# Patient Record
Sex: Female | Born: 1990 | Race: Black or African American | Hispanic: No | Marital: Single
Health system: Southern US, Academic
[De-identification: ages and names within clinical notes are randomized; demographics above are authoritative.]

## PROBLEM LIST (undated history)

## (undated) ENCOUNTER — Telehealth: Attending: MS" | Primary: MS"

## (undated) ENCOUNTER — Telehealth

## (undated) ENCOUNTER — Encounter

## (undated) ENCOUNTER — Encounter: Attending: Maternal & Fetal Medicine | Primary: Maternal & Fetal Medicine

## (undated) ENCOUNTER — Ambulatory Visit

## (undated) ENCOUNTER — Encounter
Attending: Student in an Organized Health Care Education/Training Program | Primary: Student in an Organized Health Care Education/Training Program

## (undated) ENCOUNTER — Ambulatory Visit: Attending: Pharmacist | Primary: Pharmacist

## (undated) ENCOUNTER — Ambulatory Visit
Payer: PRIVATE HEALTH INSURANCE | Attending: Student in an Organized Health Care Education/Training Program | Primary: Student in an Organized Health Care Education/Training Program

## (undated) ENCOUNTER — Ambulatory Visit: Payer: PRIVATE HEALTH INSURANCE

## (undated) ENCOUNTER — Ambulatory Visit: Payer: MEDICAID | Attending: Internal Medicine | Primary: Internal Medicine

## (undated) ENCOUNTER — Ambulatory Visit: Payer: MEDICAID

## (undated) ENCOUNTER — Encounter: Attending: Ambulatory Care | Primary: Ambulatory Care

## (undated) ENCOUNTER — Ambulatory Visit: Payer: PRIVATE HEALTH INSURANCE | Attending: Family | Primary: Family

## (undated) ENCOUNTER — Encounter: Attending: Obstetrics & Gynecology | Primary: Obstetrics & Gynecology

## (undated) ENCOUNTER — Telehealth: Attending: Ambulatory Care | Primary: Ambulatory Care

## (undated) ENCOUNTER — Ambulatory Visit: Payer: MEDICAID | Attending: Obstetrics & Gynecology | Primary: Obstetrics & Gynecology

## (undated) ENCOUNTER — Ambulatory Visit
Payer: Medicaid (Managed Care) | Attending: Student in an Organized Health Care Education/Training Program | Primary: Student in an Organized Health Care Education/Training Program

## (undated) ENCOUNTER — Telehealth
Attending: Student in an Organized Health Care Education/Training Program | Primary: Student in an Organized Health Care Education/Training Program

## (undated) ENCOUNTER — Ambulatory Visit: Payer: Medicaid (Managed Care)

## (undated) ENCOUNTER — Ambulatory Visit: Payer: MEDICAID | Attending: Retina Specialist | Primary: Retina Specialist

## (undated) ENCOUNTER — Ambulatory Visit: Payer: MEDICAID | Attending: Family | Primary: Family

## (undated) ENCOUNTER — Encounter: Attending: Family | Primary: Family

## (undated) ENCOUNTER — Encounter: Attending: MS" | Primary: MS"

## (undated) ENCOUNTER — Ambulatory Visit: Payer: MEDICAID | Attending: Maternal & Fetal Medicine | Primary: Maternal & Fetal Medicine

## (undated) ENCOUNTER — Ambulatory Visit: Payer: MEDICAID | Attending: "Obstetric | Primary: "Obstetric

## (undated) ENCOUNTER — Telehealth: Attending: Maternal & Fetal Medicine | Primary: Maternal & Fetal Medicine

## (undated) ENCOUNTER — Telehealth: Attending: Obstetrics & Gynecology | Primary: Obstetrics & Gynecology

## (undated) ENCOUNTER — Ambulatory Visit
Attending: Student in an Organized Health Care Education/Training Program | Primary: Student in an Organized Health Care Education/Training Program

## (undated) ENCOUNTER — Encounter: Attending: Pharmacist | Primary: Pharmacist

## (undated) ENCOUNTER — Ambulatory Visit: Payer: Medicaid (Managed Care) | Attending: Family | Primary: Family

## (undated) ENCOUNTER — Ambulatory Visit: Payer: PRIVATE HEALTH INSURANCE | Attending: Nurse Practitioner | Primary: Nurse Practitioner

## (undated) ENCOUNTER — Ambulatory Visit: Attending: Adult Health | Primary: Adult Health

## (undated) ENCOUNTER — Telehealth: Attending: Family | Primary: Family

## (undated) DIAGNOSIS — M069 Rheumatoid arthritis, unspecified: Secondary | ICD-10-CM

## (undated) DIAGNOSIS — M329 Systemic lupus erythematosus, unspecified: Secondary | ICD-10-CM

## (undated) DIAGNOSIS — IMO0002 Reserved for concepts with insufficient information to code with codable children: Secondary | ICD-10-CM

---

## 2019-12-09 DIAGNOSIS — M329 Systemic lupus erythematosus, unspecified: Principal | ICD-10-CM

## 2020-02-23 ENCOUNTER — Ambulatory Visit: Admit: 2020-02-23 | Discharge: 2020-02-24 | Payer: MEDICAID | Attending: Internal Medicine | Primary: Internal Medicine

## 2020-02-23 DIAGNOSIS — M329 Systemic lupus erythematosus, unspecified: Principal | ICD-10-CM

## 2020-02-23 DIAGNOSIS — Z3A01 Less than 8 weeks gestation of pregnancy: Principal | ICD-10-CM

## 2020-02-23 MED ORDER — PREDNISONE 5 MG TABLET
ORAL_TABLET | ORAL | 0 refills | 0.00000 days | Status: CP
Start: 2020-02-23 — End: ?

## 2020-02-23 MED ORDER — HYDROXYCHLOROQUINE 200 MG TABLET
ORAL_TABLET | 4 refills | 0 days | Status: CP
Start: 2020-02-23 — End: ?

## 2020-03-22 ENCOUNTER — Telehealth: Admit: 2020-03-22 | Discharge: 2020-03-23 | Payer: MEDICAID | Attending: Women's Health | Primary: Women's Health

## 2020-03-22 DIAGNOSIS — O09893 Supervision of other high risk pregnancies, third trimester: Secondary | ICD-10-CM | POA: Insufficient documentation

## 2020-03-22 DIAGNOSIS — O99891 Other specified diseases and conditions complicating pregnancy: Secondary | ICD-10-CM | POA: Insufficient documentation

## 2020-03-22 DIAGNOSIS — O0991 Supervision of high risk pregnancy, unspecified, first trimester: Principal | ICD-10-CM

## 2020-03-22 DIAGNOSIS — O09899 Supervision of other high risk pregnancies, unspecified trimester: Principal | ICD-10-CM

## 2020-03-22 DIAGNOSIS — O3680X0 Pregnancy with inconclusive fetal viability, not applicable or unspecified: Principal | ICD-10-CM

## 2020-03-22 DIAGNOSIS — M329 Systemic lupus erythematosus, unspecified: Secondary | ICD-10-CM

## 2020-03-22 MED ORDER — DOXYLAMINE 10 MG-PYRIDOXINE (VIT B6) 10 MG TABLET,DELAYED RELEASE
ORAL_TABLET | Freq: Every evening | ORAL | 3 refills | 30.00000 days | Status: CP
Start: 2020-03-22 — End: ?

## 2020-04-06 ENCOUNTER — Telehealth: Admit: 2020-04-06 | Discharge: 2020-04-07 | Payer: MEDICAID | Attending: MS" | Primary: MS"

## 2020-04-07 ENCOUNTER — Other Ambulatory Visit
Admit: 2020-04-07 | Discharge: 2020-04-07 | Payer: MEDICAID | Attending: Student in an Organized Health Care Education/Training Program | Primary: Student in an Organized Health Care Education/Training Program

## 2020-04-07 ENCOUNTER — Ambulatory Visit: Admit: 2020-04-07 | Discharge: 2020-04-07 | Payer: MEDICAID

## 2020-04-07 DIAGNOSIS — O99891 Systemic lupus erythematosus affecting pregnancy (CMS-HCC): Secondary | ICD-10-CM

## 2020-04-07 DIAGNOSIS — O351XX0 Maternal care for (suspected) chromosomal abnormality in fetus, not applicable or unspecified: Principal | ICD-10-CM

## 2020-04-07 DIAGNOSIS — Z3A11 11 weeks gestation of pregnancy: Principal | ICD-10-CM

## 2020-04-07 DIAGNOSIS — O0991 Supervision of high risk pregnancy, unspecified, first trimester: Principal | ICD-10-CM

## 2020-04-07 DIAGNOSIS — O30041 Twin pregnancy, dichorionic/diamniotic, first trimester: Principal | ICD-10-CM

## 2020-04-07 DIAGNOSIS — M329 Systemic lupus erythematosus, unspecified: Principal | ICD-10-CM

## 2020-04-07 DIAGNOSIS — O09899 Supervision of other high risk pregnancies, unspecified trimester: Principal | ICD-10-CM

## 2020-04-09 DIAGNOSIS — O23599 Infection of other part of genital tract in pregnancy, unspecified trimester: Principal | ICD-10-CM

## 2020-04-09 DIAGNOSIS — B9689 Other specified bacterial agents as the cause of diseases classified elsewhere: Principal | ICD-10-CM

## 2020-04-09 MED ORDER — METRONIDAZOLE 500 MG TABLET
ORAL_TABLET | Freq: Two times a day (BID) | ORAL | 0 refills | 7.00000 days | Status: CP
Start: 2020-04-09 — End: 2020-04-16

## 2020-04-14 DIAGNOSIS — O30041 Twin pregnancy, dichorionic/diamniotic, first trimester: Principal | ICD-10-CM

## 2020-04-14 DIAGNOSIS — O351XX0 Maternal care for (suspected) chromosomal abnormality in fetus, not applicable or unspecified: Principal | ICD-10-CM

## 2020-05-06 ENCOUNTER — Ambulatory Visit
Admit: 2020-05-06 | Discharge: 2020-05-06 | Payer: MEDICAID | Attending: Maternal & Fetal Medicine | Primary: Maternal & Fetal Medicine

## 2020-05-06 ENCOUNTER — Ambulatory Visit: Admit: 2020-05-06 | Discharge: 2020-05-06 | Payer: MEDICAID

## 2020-05-06 ENCOUNTER — Other Ambulatory Visit: Admit: 2020-05-06 | Discharge: 2020-05-07 | Payer: MEDICAID

## 2020-05-06 DIAGNOSIS — O30041 Twin pregnancy, dichorionic/diamniotic, first trimester: Principal | ICD-10-CM

## 2020-05-06 DIAGNOSIS — O99891 Systemic lupus erythematosus affecting pregnancy (CMS-HCC): Principal | ICD-10-CM

## 2020-05-06 DIAGNOSIS — O09899 Supervision of other high risk pregnancies, unspecified trimester: Principal | ICD-10-CM

## 2020-05-06 DIAGNOSIS — M329 Systemic lupus erythematosus, unspecified: Principal | ICD-10-CM

## 2020-05-06 DIAGNOSIS — O0991 Supervision of high risk pregnancy, unspecified, first trimester: Principal | ICD-10-CM

## 2020-05-06 MED ORDER — ASPIRIN 81 MG TABLET,DELAYED RELEASE
ORAL_TABLET | Freq: Every day | ORAL | 9 refills | 30 days | Status: CP
Start: 2020-05-06 — End: ?

## 2020-05-06 MED ORDER — FAMOTIDINE 20 MG TABLET
ORAL_TABLET | Freq: Two times a day (BID) | ORAL | 1 refills | 30 days | Status: CP
Start: 2020-05-06 — End: 2021-05-06

## 2020-05-12 ENCOUNTER — Ambulatory Visit: Admit: 2020-05-12 | Discharge: 2020-05-13 | Payer: MEDICAID

## 2020-05-12 DIAGNOSIS — M329 Systemic lupus erythematosus, unspecified: Secondary | ICD-10-CM

## 2020-05-12 DIAGNOSIS — O30041 Twin pregnancy, dichorionic/diamniotic, first trimester: Principal | ICD-10-CM

## 2020-05-12 DIAGNOSIS — O99891 Systemic lupus erythematosus affecting pregnancy (CMS-HCC): Secondary | ICD-10-CM

## 2020-05-12 DIAGNOSIS — O0991 Supervision of high risk pregnancy, unspecified, first trimester: Principal | ICD-10-CM

## 2020-05-12 DIAGNOSIS — O09899 Supervision of other high risk pregnancies, unspecified trimester: Principal | ICD-10-CM

## 2020-05-12 MED ORDER — METOCLOPRAMIDE 10 MG TABLET
ORAL_TABLET | Freq: Every evening | ORAL | 3 refills | 0.00000 days | Status: CP | PRN
Start: 2020-05-12 — End: 2021-05-12

## 2020-05-12 MED ORDER — ONDANSETRON HCL 4 MG TABLET
ORAL_TABLET | Freq: Three times a day (TID) | ORAL | 1 refills | 10.00000 days | Status: CP | PRN
Start: 2020-05-12 — End: 2021-05-12

## 2020-05-27 ENCOUNTER — Ambulatory Visit
Admit: 2020-05-27 | Discharge: 2020-05-28 | Payer: MEDICAID | Attending: Obstetrics & Gynecology | Primary: Obstetrics & Gynecology

## 2020-05-27 ENCOUNTER — Ambulatory Visit: Admit: 2020-05-27 | Discharge: 2020-05-28 | Payer: MEDICAID

## 2020-05-27 DIAGNOSIS — O30041 Twin pregnancy, dichorionic/diamniotic, first trimester: Principal | ICD-10-CM

## 2020-05-27 DIAGNOSIS — M329 Systemic lupus erythematosus, unspecified: Secondary | ICD-10-CM

## 2020-05-27 DIAGNOSIS — O99891 Systemic lupus erythematosus affecting pregnancy (CMS-HCC): Principal | ICD-10-CM

## 2020-05-27 DIAGNOSIS — N390 Urinary tract infection, site not specified: Principal | ICD-10-CM

## 2020-05-27 DIAGNOSIS — O0991 Supervision of high risk pregnancy, unspecified, first trimester: Principal | ICD-10-CM

## 2020-06-09 ENCOUNTER — Ambulatory Visit: Admit: 2020-06-09 | Discharge: 2020-06-10 | Payer: MEDICAID

## 2020-06-09 ENCOUNTER — Encounter: Admit: 2020-06-09 | Discharge: 2020-06-09 | Payer: MEDICAID

## 2020-06-09 DIAGNOSIS — M329 Systemic lupus erythematosus, unspecified: Principal | ICD-10-CM

## 2020-06-09 DIAGNOSIS — O0991 Supervision of high risk pregnancy, unspecified, first trimester: Principal | ICD-10-CM

## 2020-06-09 DIAGNOSIS — O99891 Systemic lupus erythematosus affecting pregnancy (CMS-HCC): Secondary | ICD-10-CM

## 2020-06-09 DIAGNOSIS — N390 Urinary tract infection, site not specified: Principal | ICD-10-CM

## 2020-06-09 DIAGNOSIS — O30041 Twin pregnancy, dichorionic/diamniotic, first trimester: Principal | ICD-10-CM

## 2020-06-13 DIAGNOSIS — O2340 Unspecified infection of urinary tract in pregnancy, unspecified trimester: Principal | ICD-10-CM

## 2020-06-13 MED ORDER — NITROFURANTOIN MONOHYDRATE/MACROCRYSTALS 100 MG CAPSULE
ORAL_CAPSULE | Freq: Two times a day (BID) | ORAL | 0 refills | 7.00000 days | Status: CP
Start: 2020-06-13 — End: 2020-06-20

## 2020-06-28 ENCOUNTER — Ambulatory Visit
Admit: 2020-06-28 | Discharge: 2020-06-29 | Payer: MEDICAID | Attending: Obstetrics & Gynecology | Primary: Obstetrics & Gynecology

## 2020-06-28 ENCOUNTER — Encounter
Admit: 2020-06-28 | Discharge: 2020-06-28 | Payer: MEDICAID | Attending: Obstetrics & Gynecology | Primary: Obstetrics & Gynecology

## 2020-06-28 DIAGNOSIS — Z315 Encounter for genetic counseling: Principal | ICD-10-CM

## 2020-06-28 DIAGNOSIS — O99891 Systemic lupus erythematosus affecting pregnancy (CMS-HCC): Secondary | ICD-10-CM

## 2020-06-28 DIAGNOSIS — M329 Systemic lupus erythematosus, unspecified: Secondary | ICD-10-CM

## 2020-06-28 DIAGNOSIS — O09899 Supervision of other high risk pregnancies, unspecified trimester: Principal | ICD-10-CM

## 2020-06-28 DIAGNOSIS — O0991 Supervision of high risk pregnancy, unspecified, first trimester: Principal | ICD-10-CM

## 2020-06-28 DIAGNOSIS — B379 Candidiasis, unspecified: Principal | ICD-10-CM

## 2020-06-28 DIAGNOSIS — N390 Urinary tract infection, site not specified: Principal | ICD-10-CM

## 2020-06-28 MED ORDER — NITROFURANTOIN MONOHYDRATE/MACROCRYSTALS 100 MG CAPSULE
ORAL_CAPSULE | Freq: Every day | ORAL | 3 refills | 30 days | Status: CP
Start: 2020-06-28 — End: 2020-07-28

## 2020-06-28 MED ORDER — TRIAMCINOLONE ACETONIDE 0.1 % TOPICAL CREAM
Freq: Two times a day (BID) | TOPICAL | 0 refills | 0.00000 days | Status: CP
Start: 2020-06-28 — End: 2021-06-28

## 2020-06-28 MED ORDER — AMOXICILLIN 875 MG-POTASSIUM CLAVULANATE 125 MG TABLET
ORAL_TABLET | Freq: Two times a day (BID) | ORAL | 0 refills | 10 days | Status: CP
Start: 2020-06-28 — End: 2020-07-08

## 2020-07-05 ENCOUNTER — Ambulatory Visit: Admit: 2020-07-05 | Discharge: 2020-07-06 | Payer: MEDICAID

## 2020-07-05 ENCOUNTER — Other Ambulatory Visit: Admit: 2020-07-05 | Discharge: 2020-07-06 | Payer: MEDICAID

## 2020-07-05 DIAGNOSIS — O365990 Maternal care for other known or suspected poor fetal growth, unspecified trimester, not applicable or unspecified: Principal | ICD-10-CM

## 2020-07-07 ENCOUNTER — Telehealth: Admit: 2020-07-07 | Discharge: 2020-07-08 | Payer: MEDICAID | Attending: MS" | Primary: MS"

## 2020-07-08 ENCOUNTER — Ambulatory Visit: Admit: 2020-07-08 | Discharge: 2020-07-09 | Payer: MEDICAID

## 2020-07-12 ENCOUNTER — Ambulatory Visit: Admit: 2020-07-12 | Discharge: 2020-07-13 | Payer: MEDICAID | Attending: "Obstetric | Primary: "Obstetric

## 2020-07-12 ENCOUNTER — Encounter: Admit: 2020-07-12 | Discharge: 2020-07-12 | Payer: MEDICAID | Attending: "Obstetric | Primary: "Obstetric

## 2020-07-12 ENCOUNTER — Ambulatory Visit: Admit: 2020-07-12 | Discharge: 2020-07-13 | Payer: MEDICAID

## 2020-07-12 DIAGNOSIS — O99891 Systemic lupus erythematosus affecting pregnancy (CMS-HCC): Principal | ICD-10-CM

## 2020-07-12 DIAGNOSIS — O30041 Twin pregnancy, dichorionic/diamniotic, first trimester: Principal | ICD-10-CM

## 2020-07-12 DIAGNOSIS — N390 Urinary tract infection, site not specified: Principal | ICD-10-CM

## 2020-07-12 DIAGNOSIS — B379 Candidiasis, unspecified: Principal | ICD-10-CM

## 2020-07-12 DIAGNOSIS — M329 Systemic lupus erythematosus, unspecified: Principal | ICD-10-CM

## 2020-07-12 DIAGNOSIS — O09899 Supervision of other high risk pregnancies, unspecified trimester: Principal | ICD-10-CM

## 2020-07-12 DIAGNOSIS — O2342 Unspecified infection of urinary tract in pregnancy, second trimester: Principal | ICD-10-CM

## 2020-07-12 DIAGNOSIS — O365990 Maternal care for other known or suspected poor fetal growth, unspecified trimester, not applicable or unspecified: Principal | ICD-10-CM

## 2020-07-12 DIAGNOSIS — O0991 Supervision of high risk pregnancy, unspecified, first trimester: Principal | ICD-10-CM

## 2020-07-12 MED ORDER — FAMOTIDINE 20 MG TABLET
ORAL_TABLET | 1 refills | 0 days | Status: CP
Start: 2020-07-12 — End: ?

## 2020-07-26 ENCOUNTER — Institutional Professional Consult (permissible substitution): Admit: 2020-07-26 | Discharge: 2020-07-27 | Payer: MEDICAID

## 2020-07-26 ENCOUNTER — Ambulatory Visit: Admit: 2020-07-26 | Discharge: 2020-07-27 | Payer: MEDICAID

## 2020-07-26 ENCOUNTER — Other Ambulatory Visit
Admit: 2020-07-26 | Discharge: 2020-07-27 | Payer: MEDICAID | Attending: Obstetrics & Gynecology | Primary: Obstetrics & Gynecology

## 2020-07-26 ENCOUNTER — Encounter
Admit: 2020-07-26 | Discharge: 2020-07-26 | Payer: MEDICAID | Attending: Obstetrics & Gynecology | Primary: Obstetrics & Gynecology

## 2020-07-26 DIAGNOSIS — O365990 Maternal care for other known or suspected poor fetal growth, unspecified trimester, not applicable or unspecified: Principal | ICD-10-CM

## 2020-07-26 DIAGNOSIS — O0993 Supervision of high risk pregnancy, unspecified, third trimester: Principal | ICD-10-CM

## 2020-07-27 ENCOUNTER — Ambulatory Visit
Admit: 2020-07-27 | Discharge: 2020-08-03 | Disposition: A | Discharge status: Home / Self Care | Payer: MEDICAID | Source: Ambulatory Visit | Admitting: Obstetrics & Gynecology

## 2020-07-27 ENCOUNTER — Encounter
Admit: 2020-07-27 | Discharge: 2020-08-03 | Disposition: A | Discharge status: Home / Self Care | Payer: MEDICAID | Source: Ambulatory Visit | Attending: Student in an Organized Health Care Education/Training Program | Admitting: Obstetrics & Gynecology

## 2020-08-03 MED ORDER — ACETAMINOPHEN 325 MG TABLET
ORAL_TABLET | Freq: Four times a day (QID) | ORAL | 0 refills | 15.00000 days | Status: CP
Start: 2020-08-03 — End: ?

## 2020-08-03 MED ORDER — SIMETHICONE 80 MG CHEWABLE TABLET
ORAL_TABLET | Freq: Four times a day (QID) | ORAL | 0 refills | 8.00000 days | Status: CP | PRN
Start: 2020-08-03 — End: 2020-09-02

## 2020-08-03 MED ORDER — IBUPROFEN 600 MG TABLET
ORAL_TABLET | Freq: Four times a day (QID) | ORAL | 0 refills | 15 days | Status: CP
Start: 2020-08-03 — End: ?

## 2020-08-03 MED ORDER — ONDANSETRON 4 MG DISINTEGRATING TABLET
ORAL_TABLET | Freq: Three times a day (TID) | ORAL | 0 refills | 7 days | Status: CP | PRN
Start: 2020-08-03 — End: 2020-08-10

## 2020-08-03 MED ORDER — OXYCODONE 5 MG TABLET
ORAL_TABLET | Freq: Four times a day (QID) | ORAL | 0 refills | 3.00000 days | Status: CP | PRN
Start: 2020-08-03 — End: 2020-08-08

## 2020-08-03 MED ORDER — FERROUS SULFATE 325 MG (65 MG IRON) TABLET
ORAL_TABLET | ORAL | 11 refills | 30 days | Status: CP
Start: 2020-08-03 — End: 2021-08-03

## 2020-08-03 MED ORDER — ETONOGESTREL 0.12 MG-ETHINYL ESTRADIOL 0.015 MG/24 HR VAGINAL RING
4 refills | 0 days | Status: CP
Start: 2020-08-03 — End: 2020-08-03

## 2020-08-31 MED ORDER — ETONOGESTREL 0.12 MG-ETHINYL ESTRADIOL 0.015 MG/24 HR VAGINAL RING
TRANSDERMAL | 4 refills | 0.00000 days | Status: CP
Start: 2020-08-31 — End: 2021-08-03

## 2020-09-23 ENCOUNTER — Ambulatory Visit: Admit: 2020-09-23 | Payer: MEDICAID | Attending: Obstetrics & Gynecology | Primary: Obstetrics & Gynecology

## 2021-03-14 ENCOUNTER — Telehealth: Admit: 2021-03-14 | Discharge: 2021-03-15 | Payer: MEDICAID | Attending: Family | Primary: Family

## 2021-03-14 MED ORDER — PREDNISONE 5 MG TABLET
ORAL_TABLET | ORAL | 0 refills | 12.00000 days | Status: CP
Start: 2021-03-14 — End: 2021-03-26

## 2021-03-28 ENCOUNTER — Ambulatory Visit: Admit: 2021-03-28 | Discharge: 2021-03-29 | Payer: MEDICAID

## 2021-03-28 ENCOUNTER — Ambulatory Visit: Admit: 2021-03-28 | Discharge: 2021-03-29 | Payer: MEDICAID | Attending: Family | Primary: Family

## 2021-03-28 DIAGNOSIS — M329 Systemic lupus erythematosus, unspecified: Principal | ICD-10-CM

## 2021-03-28 DIAGNOSIS — D649 Anemia, unspecified: Principal | ICD-10-CM

## 2021-04-03 ENCOUNTER — Ambulatory Visit: Admit: 2021-04-03 | Payer: MEDICAID | Attending: Retina Specialist | Primary: Retina Specialist

## 2021-04-03 DIAGNOSIS — M2550 Pain in unspecified joint: Principal | ICD-10-CM

## 2021-04-04 MED ORDER — FOLIC ACID 1 MG TABLET
ORAL_TABLET | Freq: Every day | ORAL | 11 refills | 30.00000 days | Status: CP
Start: 2021-04-04 — End: 2022-04-04

## 2021-04-04 MED ORDER — METHOTREXATE SODIUM 2.5 MG TABLET
ORAL_TABLET | ORAL | 0 refills | 28 days | Status: CP
Start: 2021-04-04 — End: 2021-05-04

## 2021-04-04 MED ORDER — FERROUS GLUCONATE 240 MG (27 MG IRON) TABLET
ORAL_TABLET | Freq: Three times a day (TID) | ORAL | 0 refills | 90 days | Status: CP
Start: 2021-04-04 — End: 2021-07-03

## 2021-05-16 DIAGNOSIS — M1990 Unspecified osteoarthritis, unspecified site: Principal | ICD-10-CM

## 2021-05-16 MED ORDER — PREDNISONE 5 MG TABLET
ORAL_TABLET | ORAL | 0 refills | 12.00000 days | Status: CP
Start: 2021-05-16 — End: 2021-05-16

## 2021-05-16 MED ORDER — DEXAMETHASONE 1 MG TABLET
ORAL_TABLET | ORAL | 0 refills | 15.00000 days | Status: CP
Start: 2021-05-16 — End: 2021-05-31

## 2021-07-12 ENCOUNTER — Ambulatory Visit: Admit: 2021-07-12 | Payer: MEDICAID | Attending: Family | Primary: Family

## 2021-08-23 ENCOUNTER — Emergency Department (HOSPITAL_BASED_OUTPATIENT_CLINIC_OR_DEPARTMENT_OTHER)
Admission: EM | Admit: 2021-08-23 | Discharge: 2021-08-23 | Disposition: A | Discharge status: Home / Self Care | Payer: Medicaid Other | Attending: Emergency Medicine | Admitting: Emergency Medicine

## 2021-08-23 ENCOUNTER — Other Ambulatory Visit: Payer: Self-pay

## 2021-08-23 ENCOUNTER — Emergency Department (HOSPITAL_BASED_OUTPATIENT_CLINIC_OR_DEPARTMENT_OTHER): Payer: Medicaid Other

## 2021-08-23 ENCOUNTER — Other Ambulatory Visit (HOSPITAL_COMMUNITY): Payer: Self-pay

## 2021-08-23 ENCOUNTER — Ambulatory Visit (INDEPENDENT_AMBULATORY_CARE_PROVIDER_SITE_OTHER): Payer: Medicaid Other | Admitting: *Deleted

## 2021-08-23 ENCOUNTER — Encounter (HOSPITAL_BASED_OUTPATIENT_CLINIC_OR_DEPARTMENT_OTHER): Payer: Self-pay

## 2021-08-23 DIAGNOSIS — U071 COVID-19: Secondary | ICD-10-CM | POA: Insufficient documentation

## 2021-08-23 DIAGNOSIS — J9801 Acute bronchospasm: Secondary | ICD-10-CM

## 2021-08-23 DIAGNOSIS — R059 Cough, unspecified: Secondary | ICD-10-CM | POA: Diagnosis present

## 2021-08-23 HISTORY — DX: Systemic lupus erythematosus, unspecified: M32.9

## 2021-08-23 HISTORY — DX: Rheumatoid arthritis, unspecified: M06.9

## 2021-08-23 HISTORY — DX: Reserved for concepts with insufficient information to code with codable children: IMO0002

## 2021-08-23 LAB — RESP PANEL BY RT-PCR (FLU A&B, COVID) ARPGX2
Influenza A by PCR: NEGATIVE
Influenza B by PCR: NEGATIVE
SARS Coronavirus 2 by RT PCR: POSITIVE — AB

## 2021-08-23 LAB — CBC WITH DIFFERENTIAL/PLATELET
Abs Immature Granulocytes: 0.01 10*3/uL (ref 0.00–0.07)
Basophils Absolute: 0 10*3/uL (ref 0.0–0.1)
Basophils Relative: 0 %
Eosinophils Absolute: 0.2 10*3/uL (ref 0.0–0.5)
Eosinophils Relative: 3 %
HCT: 27.5 % — ABNORMAL LOW (ref 36.0–46.0)
Hemoglobin: 8.9 g/dL — ABNORMAL LOW (ref 12.0–15.0)
Immature Granulocytes: 0 %
Lymphocytes Relative: 31 %
Lymphs Abs: 1.5 10*3/uL (ref 0.7–4.0)
MCH: 26.1 pg (ref 26.0–34.0)
MCHC: 32.4 g/dL (ref 30.0–36.0)
MCV: 80.6 fL (ref 80.0–100.0)
Monocytes Absolute: 0.4 10*3/uL (ref 0.1–1.0)
Monocytes Relative: 8 %
Neutro Abs: 2.8 10*3/uL (ref 1.7–7.7)
Neutrophils Relative %: 58 %
Platelets: 387 10*3/uL (ref 150–400)
RBC: 3.41 MIL/uL — ABNORMAL LOW (ref 3.87–5.11)
RDW: 15.7 % — ABNORMAL HIGH (ref 11.5–15.5)
WBC: 4.9 10*3/uL (ref 4.0–10.5)
nRBC: 0 % (ref 0.0–0.2)

## 2021-08-23 LAB — BASIC METABOLIC PANEL
Anion gap: 11 (ref 5–15)
BUN: 8 mg/dL (ref 6–20)
CO2: 18 mmol/L — ABNORMAL LOW (ref 22–32)
Calcium: 9.3 mg/dL (ref 8.9–10.3)
Chloride: 105 mmol/L (ref 98–111)
Creatinine, Ser: 0.47 mg/dL (ref 0.44–1.00)
GFR, Estimated: >60 mL/min (ref >60)
Glucose, Bld: 88 mg/dL (ref 70–99)
Potassium: 3.3 mmol/L — ABNORMAL LOW (ref 3.5–5.1)
Sodium: 134 mmol/L — ABNORMAL LOW (ref 135–145)

## 2021-08-23 LAB — HCG, QUANTITATIVE, PREGNANCY: hCG, Beta Chain, Quant, S: 40970 m[IU]/mL — ABNORMAL HIGH (ref <5)

## 2021-08-23 MED ORDER — DIPHENHYDRAMINE HCL 50 MG/ML IJ SOLN: 50.0000 mg | Freq: Once | INTRAMUSCULAR | Status: AC | PRN

## 2021-08-23 MED ORDER — EPINEPHRINE 0.3 MG/0.3ML IJ SOAJ: 0.3000 mg | Freq: Once | INTRAMUSCULAR | Status: AC | PRN

## 2021-08-23 MED ORDER — ACETAMINOPHEN 500 MG PO TABS
1000.0000 mg | ORAL_TABLET | Freq: Once | ORAL | Status: AC
  Administered 2021-08-23: 1000 mg via ORAL
  Filled 2021-08-23: qty 2

## 2021-08-23 MED ORDER — ALBUTEROL SULFATE HFA 108 (90 BASE) MCG/ACT IN AERS
6.0000 | INHALATION_SPRAY | Freq: Once | RESPIRATORY_TRACT | Status: AC
  Administered 2021-08-23: 6 via RESPIRATORY_TRACT
  Filled 2021-08-23: qty 6.7

## 2021-08-23 MED ORDER — METHYLPREDNISOLONE SODIUM SUCC 125 MG IJ SOLR: 125.0000 mg | Freq: Once | INTRAMUSCULAR | Status: AC | PRN

## 2021-08-23 MED ORDER — FAMOTIDINE IN NACL 20-0.9 MG/50ML-% IV SOLN: 20.0000 mg | Freq: Once | INTRAVENOUS | Status: AC | PRN

## 2021-08-23 MED ORDER — BEBTELOVIMAB 175 MG/2 ML IV (EUA): 175.0000 mg | Freq: Once | INTRAMUSCULAR | Status: AC

## 2021-08-23 MED ORDER — SODIUM CHLORIDE 0.9 % IV SOLN: INTRAVENOUS | Status: AC | PRN

## 2021-08-23 MED ORDER — ALBUTEROL SULFATE HFA 108 (90 BASE) MCG/ACT IN AERS: 2.0000 | INHALATION_SPRAY | Freq: Once | RESPIRATORY_TRACT | Status: AC | PRN

## 2021-08-23 MED ORDER — PREDNISONE 20 MG PO TABS
20.0000 mg | ORAL_TABLET | Freq: Once | ORAL | Status: AC
  Administered 2021-08-23: 20 mg via ORAL
  Filled 2021-08-23: qty 1

## 2021-08-23 MED ORDER — BEBTELOVIMAB 175 MG/2 ML IV (EUA)
175.0000 mg | Freq: Once | INTRAMUSCULAR | Status: AC
  Administered 2021-08-23: 175 mg via INTRAVENOUS

## 2021-08-23 MED ORDER — PREDNISONE 50 MG PO TABS: 50.0000 mg | ORAL_TABLET | Freq: Every day | ORAL | 0 refills | Status: DC

## 2021-08-23 NOTE — Discharge Instructions (Addendum)
Use your inhaler to help with coughing wheezing.  Take the steroids to help with the wheezing.  You should be contacted by the Cov[facility] infusion center regarding the antibody infusion.  Follow up with your primary care or OB GYN doctor to be rechecked if the symptoms are not improving.  Return as needed for worsening symptoms

## 2021-08-23 NOTE — ED Provider Notes (Signed)
[facility] EMERGENCY DEPT Provider Note  CSN: [id number] Arrival date & time: 08/23/21 0544  Chief Complaint(s) Cough and Shortness of Breath  HPI Claire Wood is a 30 y.o. female with SLE on Plaquenil currently pregnant ([redacted]w[redacted]d by LMP)   Cough Cough characteristics:  Dry Severity:  Moderate Onset quality:  Gradual Duration:  3 days Timing:  Intermittent Progression:  Worsening Chronicity:  New Context: sick contacts (son had URI last week)   Relieved by:  Nothing Worsened by:  Deep breathing Associated symptoms: headaches, myalgias, shortness of breath and sinus congestion   Associated symptoms: no chills, no fever and no rhinorrhea   Shortness of Breath Associated symptoms: cough and headaches   Associated symptoms: no fever     Patient reports a remote history of asthma as an adolescent and teen.  Denies any prior history of DVTs or PEs.  Claire Wood denies any lower extremity swelling, or pain.  Past Medical History Past Medical History:  Diagnosis Date   Lupus (HCC)    RA (rheumatoid arthritis) (HCC)    There are no problems to display for this patient.  Home Medication(s) Prior to Admission medications   Not on File                                                                                                                                    Past Surgical History History reviewed. No pertinent surgical history. Family History No family history on file.  Social History Social History   Tobacco Use   Smoking status: Never   Smokeless tobacco: Never  Substance Use Topics   Alcohol use: Not Currently   Drug use: Not Currently   Allergies Patient has no allergy information on record.  Review of Systems Review of Systems  Constitutional:  Negative for chills and fever.  HENT:  Negative for rhinorrhea.   Respiratory:  Positive for cough and shortness of breath.   Musculoskeletal:  Positive for myalgias.  Neurological:  Positive for  headaches.  All other systems are reviewed and are negative for acute change except as noted in the HPI  Physical Exam Vital Signs  I have reviewed the triage vital signs BP 125/81 (BP Location: Right Arm)   Pulse 95   Temp 98.4 F (36.9 C) (Oral)   Resp 13   Ht 5\' 4"  (1.626 m)   Wt 56.7 kg   LMP 07/12/2021 (Approximate)   SpO2 100%   BMI 21.46 kg/m   Physical Exam Vitals reviewed.  Constitutional:      General: Claire Wood is not in acute distress.    Appearance: Claire Wood is well-developed. Claire Wood is not diaphoretic.  HENT:     Head: Normocephalic and atraumatic.     Nose: Nose normal.  Eyes:     General: No scleral icterus.       Right eye: No discharge.        Left eye: No discharge.  Conjunctiva/sclera: Conjunctivae normal.     Pupils: Pupils are equal, round, and reactive to light.  Cardiovascular:     Rate and Rhythm: Normal rate and regular rhythm.     Heart sounds: No murmur heard.   No friction rub. No gallop.  Pulmonary:     Effort: Pulmonary effort is normal. Tachypnea present. No respiratory distress.     Breath sounds: No stridor. Wheezing present. No rales.     Comments: Speaks in short sentences. Dry wheezy cough Abdominal:     General: There is no distension.     Palpations: Abdomen is soft.     Tenderness: There is no abdominal tenderness.  Musculoskeletal:        General: No tenderness.     Cervical back: Normal range of motion and neck supple.  Skin:    General: Skin is warm and dry.     Findings: No erythema or rash.  Neurological:     Mental Status: Claire Wood is alert and oriented to person, place, and time.    ED Results and Treatments Labs (all labs ordered are listed, but only abnormal results are displayed) Labs Reviewed  RESP PANEL BY RT-PCR (FLU A&B, COVID) ARPGX2 - Abnormal; Notable for the following components:      Result Value   SARS Coronavirus 2 by RT PCR POSITIVE (*)    All other components within normal limits  CBC WITH  DIFFERENTIAL/PLATELET - Abnormal; Notable for the following components:   RBC 3.41 (*)    Hemoglobin 8.9 (*)    HCT 27.5 (*)    RDW 15.7 (*)    All other components within normal limits  BASIC METABOLIC PANEL  HCG, QUANTITATIVE, PREGNANCY                                                                                                                         EKG  EKG Interpretation  Date/Time:  Wednesday August 23 2021 06:08:00 EDT Ventricular Rate:  85 PR Interval:  128 QRS Duration: 80 QT Interval:  347 QTC Calculation: 413 R Axis:   70 Text Interpretation: Sinus rhythm No significant change since last tracing Confirmed by Linwood Dibbles [phone] on 08/23/2021 7:13:24 AM       Radiology DG Chest Portable 1 View  Result Date: 08/23/2021 CLINICAL DATA:  Short of breath. EXAM: PORTABLE CHEST 1 VIEW COMPARISON:  None. FINDINGS: The heart size and mediastinal contours are within normal limits. Both lungs are clear. The visualized skeletal structures are unremarkable. IMPRESSION: No active disease. Electronically Signed   By: Signa Kell M.D.   On: 08/23/2021 06:51    Pertinent labs & imaging results that were available during my care of the patient were reviewed by me and considered in my medical decision making (see MDM for details).  Medications Ordered in ED Medications  acetaminophen (TYLENOL) tablet 1,000 mg (has no administration in time range)  predniSONE (DELTASONE) tablet 20 mg (has no administration in time range)  albuterol (  VENTOLIN HFA) 108 (90 Base) MCG/ACT inhaler 6 puff (6 puffs Inhalation Given 08/23/21 3382)                                                                                                                                     Procedures Procedures  (including critical care time)  Medical Decision Making / ED Course I have reviewed the nursing notes for this encounter and the patient's prior records (if available in EHR or on provided  paperwork).  Tove Trevizo was evaluated in Emergency Department on 08/23/2021 for the symptoms described in the history of present illness. Claire Wood was evaluated in the context of the global COVID-19 pandemic, which necessitated consideration that the patient might be at risk for infection with the SARS-CoV-2 virus that causes COVID-19. Institutional protocols and algorithms that pertain to the evaluation of patients at risk for COVID-19 are in a state of rapid change based on information released by regulatory bodies including the CDC and federal and state organizations. These policies and algorithms were followed during the patient's care in the ED.     3 days of gradually worsening shortness of breath with cough, nasal congestion and temporal headache. Claire Wood is afebrile with stable vital signs.  Satting well on room air however does have increased work of breathing with a wheezy cough. No rales noted on exam Breath sounds equal bilaterally.  Presentation likely related to viral process.  Will assess for COVID/influenza. Will obtain chest x-ray to rule out pneumonia/pneumothorax. Given the gradual onset of Claire Wood symptoms with other associated URI symptoms, have low suspicion for pulmonary embolism.  Will provide patient with albuterol inhaler for likely reactive airway disease. Will also obtain screening labs to assess for pneumonia, electrolyte derangements, or renal insufficiency.  Pertinent labs & imaging results that were available during my care of the patient were reviewed by me and considered in my medical decision making:  EKG without acute ischemic changes, dysrhythmias or evidence of pericarditis. Chest x-ray without evidence suggestive of pneumonia, pneumothorax, pneumomediastinum.   Notable for hyperinflated lungs. Cardiac silhouette normal and not concerning for pericardial effusion.   Patient care turned over to Dr Lynelle Doctor. Patient case and results discussed in detail; please see their  note for further ED managment.     Final Clinical Impression(s) / ED Diagnoses Final diagnoses:  None     This chart was dictated using voice recognition software.  Despite best efforts to proofread,  errors can occur which can change the documentation meaning.    Nira Conn, MD 08/23/21 [phone]

## 2021-08-23 NOTE — Progress Notes (Signed)
Diagnosis: COVID  Provider:  Chilton Greathouse, MD  Procedure: Infusion  IV Type: Peripheral, IV Location: R Hand  Bebtelovimab, Dose: 175 mg  Infusion Start Time: 1521  Infusion Stop Time: 1522  Post Infusion IV Care: Observation period completed  Discharge: Condition: Good, Destination: Home . AVS provided to patient.   Performed by:  Robb Matar, RN

## 2021-08-23 NOTE — ED Notes (Addendum)
Patient here POV from Home with SOB, Cough, Headache, Generalized Body Aches.  Patient states symptoms began approximately 2 days PTA. Patient has had recent Contact with COVID-19 Positive Person(s). Patient is Pregnant (LMP was approximately 6 weeks PTA, 07/12/2021)  NAD Noted during Triage. A&Ox4. GCS 15. Ambulatory.

## 2021-08-23 NOTE — ED Provider Notes (Signed)
Patient seen by Dr. Eudelia Bunch.  Please see his note.  Patient has improved with treatment.  Feeling better and ready for discharge.  Plan is for her to take on the albuterol inhaler and a short course of steroids for her bronchospasm.  Patient was also found to be COVID-positive.  Ambulatory order for monoclonal antibody infusion has been ordered, per policy patient is not to receive in the freestanding ED's because she is pregnant.     Linwood Dibbles, MD 08/23/21 [phone]

## 2021-08-23 NOTE — Patient Instructions (Signed)
Patient was given drug information sheet for Bebtelovimab.  Also given cost estimate sheet for Bebtelovimab.  Patient reviewed documentation and questions were answered.  Patient would like to proceed with treatment at this time.   

## 2021-09-03 ENCOUNTER — Emergency Department (HOSPITAL_COMMUNITY)
Admission: EM | Admit: 2021-09-03 | Discharge: 2021-09-03 | Disposition: A | Discharge status: Home / Self Care | Payer: Medicaid Other | Attending: Emergency Medicine | Admitting: Emergency Medicine

## 2021-09-03 ENCOUNTER — Encounter (HOSPITAL_COMMUNITY): Payer: Self-pay

## 2021-09-03 ENCOUNTER — Other Ambulatory Visit: Payer: Self-pay

## 2021-09-03 ENCOUNTER — Emergency Department (HOSPITAL_COMMUNITY): Payer: Medicaid Other

## 2021-09-03 DIAGNOSIS — O209 Hemorrhage in early pregnancy, unspecified: Secondary | ICD-10-CM | POA: Diagnosis not present

## 2021-09-03 DIAGNOSIS — Z3A08 8 weeks gestation of pregnancy: Secondary | ICD-10-CM | POA: Diagnosis not present

## 2021-09-03 DIAGNOSIS — Z8616 Personal history of COVID-19: Secondary | ICD-10-CM | POA: Diagnosis not present

## 2021-09-03 DIAGNOSIS — D649 Anemia, unspecified: Secondary | ICD-10-CM | POA: Insufficient documentation

## 2021-09-03 LAB — URINALYSIS, ROUTINE W REFLEX MICROSCOPIC
Bilirubin Urine: NEGATIVE
Glucose, UA: NEGATIVE mg/dL
Hgb urine dipstick: NEGATIVE
Ketones, ur: NEGATIVE mg/dL
Leukocytes,Ua: NEGATIVE
Nitrite: NEGATIVE
Protein, ur: NEGATIVE mg/dL
Specific Gravity, Urine: 1.011 (ref 1.005–1.030)
pH: 7 (ref 5.0–8.0)

## 2021-09-03 LAB — CBC WITH DIFFERENTIAL/PLATELET
Abs Immature Granulocytes: 0.02 10*3/uL (ref 0.00–0.07)
Basophils Absolute: 0 10*3/uL (ref 0.0–0.1)
Basophils Relative: 0 %
Eosinophils Absolute: 0.1 10*3/uL (ref 0.0–0.5)
Eosinophils Relative: 2 %
HCT: 28.5 % — ABNORMAL LOW (ref 36.0–46.0)
Hemoglobin: 9.2 g/dL — ABNORMAL LOW (ref 12.0–15.0)
Immature Granulocytes: 0 %
Lymphocytes Relative: 30 %
Lymphs Abs: 1.6 10*3/uL (ref 0.7–4.0)
MCH: 26.5 pg (ref 26.0–34.0)
MCHC: 32.3 g/dL (ref 30.0–36.0)
MCV: 82.1 fL (ref 80.0–100.0)
Monocytes Absolute: 0.4 10*3/uL (ref 0.1–1.0)
Monocytes Relative: 8 %
Neutro Abs: 3.2 10*3/uL (ref 1.7–7.7)
Neutrophils Relative %: 60 %
Platelets: 398 10*3/uL (ref 150–400)
RBC: 3.47 MIL/uL — ABNORMAL LOW (ref 3.87–5.11)
RDW: 15.5 % (ref 11.5–15.5)
WBC: 5.3 10*3/uL (ref 4.0–10.5)
nRBC: 0 % (ref 0.0–0.2)

## 2021-09-03 LAB — COMPREHENSIVE METABOLIC PANEL
ALT: 26 U/L (ref 0–44)
AST: 30 U/L (ref 15–41)
Albumin: 3.3 g/dL — ABNORMAL LOW (ref 3.5–5.0)
Alkaline Phosphatase: 55 U/L (ref 38–126)
Anion gap: 7 (ref 5–15)
BUN: 10 mg/dL (ref 6–20)
CO2: 21 mmol/L — ABNORMAL LOW (ref 22–32)
Calcium: 8.9 mg/dL (ref 8.9–10.3)
Chloride: 104 mmol/L (ref 98–111)
Creatinine, Ser: 0.43 mg/dL — ABNORMAL LOW (ref 0.44–1.00)
GFR, Estimated: >60 mL/min (ref >60)
Glucose, Bld: 89 mg/dL (ref 70–99)
Potassium: 3.6 mmol/L (ref 3.5–5.1)
Sodium: 132 mmol/L — ABNORMAL LOW (ref 135–145)
Total Bilirubin: 0.3 mg/dL (ref 0.3–1.2)
Total Protein: 8.9 g/dL — ABNORMAL HIGH (ref 6.5–8.1)

## 2021-09-03 LAB — HCG, QUANTITATIVE, PREGNANCY: hCG, Beta Chain, Quant, S: 122830 m[IU]/mL — ABNORMAL HIGH (ref <5)

## 2021-09-03 NOTE — Discharge Instructions (Signed)
You were evaluated in the emergency department today for vaginal bleeding during first trimester of pregnancy.  Lactic today and ultrasound were reassuring and we confirmed single intrauterine pregnancy.  There is a small subchorionic hemorrhage which is a common cause for first trimester bleeding.  This is nothing that I think you need to be admitted for today, but secretively important to mention to OB/GYN.  Continue to monitor how you're doing and return to the ER for new or worsening symptoms such as worsening bleeding, abdominal or pelvic pain.  It has been a pleasure seeing and caring for you today and I hope you start feeling better soon!

## 2021-09-03 NOTE — ED Provider Notes (Signed)
[facility]-EMERGENCY DEPT Provider Note   CSN: [id number] Arrival date & time: 09/03/21  1005     History Chief Complaint  Patient presents with   Vaginal Bleeding   Abdominal Pain   4 months pregnant    Claire Wood is a 30 y.o. female with history of lupus who presents with [redacted] weeks gestation of pregnancy that presents with lower abdominal pain since last night and vaginal bleeding this morning.  Patient had confirmed intrauterine pregnancy with her OB/GYN.  Last night she states that she had some pain in her lower abdomen that she originally thought was gas, persisted this morning.  She used the bathroom around 3 AM and noticed scant vaginal bleeding, did not think much of it and went back to sleep.  Woke up again at around 8 AM and noticed more bleeding prompting her to present to the emergency department.  She states that she would not be able to get in with her OB/GYN until Tuesday.  Is concerned that she is having a miscarriage.  Of note this is her second pregnancy, she was previously pregnant with twins and lost one of them in utero. She now has 80-year-old son who is doing well.  She was also seen on 9/21 for COVID-19 infection, received Bebtelovimab at infusion clinic.    Vaginal Bleeding Associated symptoms: abdominal pain and nausea   Associated symptoms: no dysuria and no fever   Abdominal Pain Associated symptoms: nausea and vaginal bleeding   Associated symptoms: no chest pain, no chills, no diarrhea, no dysuria, no fever, no hematuria and no shortness of breath       Past Medical History:  Diagnosis Date   Lupus (HCC)    RA (rheumatoid arthritis) (HCC)     There are no problems to display for this patient.   History reviewed. No pertinent surgical history.   OB History     Gravida  1   Para      Term      Preterm      AB      Living         SAB      IAB      Ectopic      Multiple      Live Births               History reviewed. No pertinent family history.  Social History   Tobacco Use   Smoking status: Never   Smokeless tobacco: Never  Vaping Use   Vaping Use: Never used  Substance Use Topics   Alcohol use: Not Currently   Drug use: Not Currently    Home Medications Prior to Admission medications   Medication Sig Start Date End Date Taking? Authorizing Provider  docusate sodium (COLACE) 100 MG capsule Take 100 mg by mouth daily as needed for mild constipation.   Yes [provider]  hydroxychloroquine (PLAQUENIL) 200 MG tablet Take 200 mg by mouth daily. 02/23/20  Yes [provider]  ibuprofen (ADVIL) 200 MG tablet Take 400 mg by mouth every 6 (six) hours as needed for mild pain.   Yes [provider]  Prenatal Vit-Fe Fumarate-FA (PNV PRENATAL PLUS MULTIVITAMIN) 27-1 MG TABS Take 1 tablet by mouth daily.   Yes [provider]  predniSONE (DELTASONE) 50 MG tablet Take 1 tablet (50 mg total) by mouth daily. Patient not taking: No sig reported 08/23/21   Linwood Dibbles, MD    Allergies  Prednisone  Review of Systems   Review of Systems  Constitutional:  Negative for chills and fever.  Respiratory:  Negative for shortness of breath.   Cardiovascular:  Negative for chest pain.  Gastrointestinal:  Positive for abdominal pain and nausea. Negative for diarrhea.  Genitourinary:  Positive for vaginal bleeding. Negative for dysuria, frequency, hematuria and pelvic pain.  All other systems reviewed and are negative.  Physical Exam Updated Vital Signs BP 130/87 (BP Location: Right Arm)   Pulse 77   Temp 98.3 F (36.8 C) (Oral)   Resp 18   Ht 5\' 4"  (1.626 m)   Wt 56.7 kg   LMP 07/12/2021 (Approximate) Comment: Patients abdomen shielded for imaging.  SpO2 100%   BMI 21.46 kg/m   Physical Exam Vitals and nursing note reviewed.  Constitutional:      Appearance: Normal appearance.  HENT:     Head: Normocephalic and atraumatic.  Eyes:      Conjunctiva/sclera: Conjunctivae normal.  Cardiovascular:     Rate and Rhythm: Normal rate and regular rhythm.  Pulmonary:     Effort: Pulmonary effort is normal. No respiratory distress.     Breath sounds: Normal breath sounds.  Abdominal:     General: There is no distension.     Palpations: Abdomen is soft.     Tenderness: There is abdominal tenderness in the suprapubic area. There is no guarding or rebound.  Skin:    General: Skin is warm and dry.  Neurological:     General: No focal deficit present.     Mental Status: She is alert.    ED Results / Procedures / Treatments   Labs (all labs ordered are listed, but only abnormal results are displayed) Labs Reviewed  COMPREHENSIVE METABOLIC PANEL - Abnormal; Notable for the following components:      Result Value   Sodium 132 (*)    Creatinine, Ser 0.43 (*)    Total Protein 8.9 (*)    Albumin 3.3 (*)    All other components within normal limits  CBC WITH DIFFERENTIAL/PLATELET - Abnormal; Notable for the following components:   RBC 3.47 (*)    Hemoglobin 9.2 (*)    HCT 28.5 (*)    All other components within normal limits  URINALYSIS, ROUTINE W REFLEX MICROSCOPIC - Abnormal; Notable for the following components:   Color, Urine STRAW (*)    All other components within normal limits  HCG, QUANTITATIVE, PREGNANCY - Abnormal; Notable for the following components:   hCG, Beta Chain, Quant, S 122,830 (*)    All other components within normal limits    EKG None  Radiology 10-22-2005 OB Comp Less 14 Wks  Result Date: 09/03/2021 CLINICAL DATA:  Vaginal bleeding. Quantitative beta HCG on 08/23/2021 was 40,970. LMP was 07/12/2021. Gestational age by LMP is 7 weeks 4 days. EXAM: OBSTETRIC <14 WK ULTRASOUND TECHNIQUE: Transabdominal ultrasound was performed for evaluation of the gestation as well as the maternal uterus and adnexal regions. COMPARISON:  None. FINDINGS: Intrauterine gestational sac: Single Yolk sac:  Visualized. Embryo:   Visualized. Cardiac Activity: Visualized. Heart Rate: 162 bpm CRL:   19.2 mm   8 w 3 d                  09/11/2021 EDC: 04/12/2022 Subchorionic hemorrhage:  Small subchorionic hemorrhage. Maternal uterus/adnexae: RIGHT corpus luteum cyst. LEFT ovary is normal. No free pelvic fluid. IMPRESSION: 1. Single living intrauterine embryo. 2. Size by ultrasound correlates within 6 days of assigned [facility]  of 04/18/2022. 3. No subchorionic hemorrhage or free fluid. Electronically Signed   By: Norva Pavlov M.D.   On: 09/03/2021 12:12    Procedures Procedures   Medications Ordered in ED Medications - No data to display  ED Course  I have reviewed the triage vital signs and the nursing notes.  Pertinent labs & imaging results that were available during my care of the patient were reviewed by me and considered in my medical decision making (see chart for details).    MDM Rules/Calculators/A&P                           Patient is a 30 year old female with history of lupus who presents with [redacted] weeks gestation of pregnancy and vaginal bleeding and lower abdominal pain.  Abdominal pain came on gradually last night and is worsening.  She noticed 2 episodes of vaginal bleeding this morning, one at 3 AM and one at 8 AM while using the restroom. She had documented intrauterine pregnancy at her OBGYN last week. She states she is unable to get in to see her OB again until Tuesday and is concerned she is having a miscarriage.   Plan to obtain labs and ultrasound today. Upon chart review patient is blood type O positive, so deferring Rh IG workup at this time. Abdominal exam soft with mild tenderness to palpation in suprapubic area without rebound or guarding. Due to patient having only mild bleeding and low concern for infection, plan to defer pelvic exam at this time in favor of pelvic US as I don't believe a pelvic exam would change my management of this patient.   CBC shows mild anemia of 9.2, improved from 8.9 eleven days  ago. Urinalysis negative for UTI. Pelvic US confirmed single living intrauterine embryo with small subchorionic hemorrhage which is consistent with scant first trimester bleeding. Patient is otherwise stable and not requiring admission or inpatient treatment for her symptoms at this time. Discussed results with patient and importance of following up with OBGYN outpatient. Discussed strict return precautions. Patient agreeable to plan.   Final Clinical Impression(s) / ED Diagnoses Final diagnoses:  Bleeding in early pregnancy    Rx / DC Orders ED Discharge Orders     None        Nacole Fluhr T, PA-C 09/03/21 1228    Alvira Monday, MD 09/04/21 0000

## 2021-09-03 NOTE — ED Notes (Signed)
US at bedside

## 2021-09-03 NOTE — ED Notes (Signed)
Reports took 400mg  motrin last night

## 2021-09-03 NOTE — ED Triage Notes (Signed)
Patient states she tested Covid + > 10 days ago.  Patient states she is 4 months pregnant and began having vaginal bleeding today and lower abdominal pain since last night.

## 2021-10-09 DIAGNOSIS — Z3490 Encounter for supervision of normal pregnancy, unspecified, unspecified trimester: Secondary | ICD-10-CM | POA: Insufficient documentation

## 2021-10-09 DIAGNOSIS — O09219 Supervision of pregnancy with history of pre-term labor, unspecified trimester: Secondary | ICD-10-CM | POA: Insufficient documentation

## 2021-10-11 DIAGNOSIS — B9620 Unspecified Escherichia coli [E. coli] as the cause of diseases classified elsewhere: Secondary | ICD-10-CM | POA: Insufficient documentation

## 2021-10-11 DIAGNOSIS — O99320 Drug use complicating pregnancy, unspecified trimester: Secondary | ICD-10-CM | POA: Insufficient documentation

## 2021-11-15 ENCOUNTER — Ambulatory Visit: Admit: 2021-11-15 | Payer: MEDICAID | Attending: Family | Primary: Family

## 2021-12-29 ENCOUNTER — Ambulatory Visit: Admit: 2021-12-29 | Discharge: 2021-12-29 | Payer: MEDICAID | Attending: Retina Specialist | Primary: Retina Specialist

## 2021-12-29 ENCOUNTER — Ambulatory Visit: Admit: 2021-12-29 | Discharge: 2021-12-29 | Payer: MEDICAID

## 2021-12-29 ENCOUNTER — Ambulatory Visit: Admit: 2021-12-29 | Discharge: 2021-12-29 | Payer: MEDICAID | Attending: Family | Primary: Family

## 2021-12-29 DIAGNOSIS — Z5181 Encounter for therapeutic drug level monitoring: Principal | ICD-10-CM

## 2021-12-29 DIAGNOSIS — Z79899 Other long term (current) drug therapy: Principal | ICD-10-CM

## 2021-12-29 DIAGNOSIS — M329 Systemic lupus erythematosus, unspecified: Principal | ICD-10-CM

## 2021-12-29 DIAGNOSIS — Z79624 Encounter for long term current use of azathioprine: Principal | ICD-10-CM

## 2021-12-29 MED ORDER — AZATHIOPRINE 50 MG TABLET
ORAL_TABLET | Freq: Every day | ORAL | 11 refills | 30 days | Status: CP
Start: 2021-12-29 — End: 2022-12-29

## 2021-12-29 MED ORDER — DEXAMETHASONE 1 MG TABLET
ORAL_TABLET | ORAL | 0 refills | 12 days | Status: CP
Start: 2021-12-29 — End: 2022-01-10

## 2022-01-01 DIAGNOSIS — O2312 Infections of bladder in pregnancy, second trimester: Principal | ICD-10-CM

## 2022-01-01 MED ORDER — NITROFURANTOIN MONOHYDRATE/MACROCRYSTALS 100 MG CAPSULE
ORAL_CAPSULE | Freq: Two times a day (BID) | ORAL | 0 refills | 5.00000 days | Status: CP
Start: 2022-01-01 — End: 2022-01-06

## 2022-01-08 ENCOUNTER — Ambulatory Visit: Admit: 2022-01-08 | Discharge: 2022-01-08 | Payer: MEDICAID | Attending: Retina Specialist | Primary: Retina Specialist

## 2022-01-08 ENCOUNTER — Telehealth: Admit: 2022-01-08 | Discharge: 2022-01-08 | Payer: MEDICAID

## 2022-01-08 DIAGNOSIS — O34219 Maternal care for unspecified type scar from previous cesarean delivery: Secondary | ICD-10-CM | POA: Insufficient documentation

## 2022-01-08 DIAGNOSIS — Z8759 Personal history of other complications of pregnancy, childbirth and the puerperium: Secondary | ICD-10-CM | POA: Insufficient documentation

## 2022-01-08 DIAGNOSIS — O99891 Systemic lupus erythematosus affecting pregnancy (CMS-HCC): Principal | ICD-10-CM

## 2022-01-08 DIAGNOSIS — Z5181 Encounter for therapeutic drug level monitoring: Principal | ICD-10-CM

## 2022-01-08 DIAGNOSIS — Z98891 History of uterine scar from previous surgery: Principal | ICD-10-CM

## 2022-01-08 DIAGNOSIS — M329 Systemic lupus erythematosus, unspecified: Principal | ICD-10-CM

## 2022-01-08 DIAGNOSIS — Z79899 Other long term (current) drug therapy: Principal | ICD-10-CM

## 2022-01-08 DIAGNOSIS — Z9221 Personal history of antineoplastic chemotherapy: Principal | ICD-10-CM

## 2022-03-05 ENCOUNTER — Other Ambulatory Visit: Payer: Self-pay

## 2022-03-05 ENCOUNTER — Inpatient Hospital Stay (HOSPITAL_COMMUNITY): Payer: Medicaid Other

## 2022-03-05 ENCOUNTER — Inpatient Hospital Stay (HOSPITAL_BASED_OUTPATIENT_CLINIC_OR_DEPARTMENT_OTHER): Payer: Medicaid Other

## 2022-03-05 ENCOUNTER — Encounter (HOSPITAL_BASED_OUTPATIENT_CLINIC_OR_DEPARTMENT_OTHER): Payer: Self-pay | Admitting: Emergency Medicine

## 2022-03-05 ENCOUNTER — Inpatient Hospital Stay (HOSPITAL_BASED_OUTPATIENT_CLINIC_OR_DEPARTMENT_OTHER)
Admission: AD | Admit: 2022-03-05 | Discharge: 2022-03-08 | DRG: 831 | Disposition: A | Discharge status: Home / Self Care | Payer: Medicaid Other | Attending: Obstetrics & Gynecology | Admitting: Obstetrics & Gynecology

## 2022-03-05 DIAGNOSIS — O2300 Infections of kidney in pregnancy, unspecified trimester: Secondary | ICD-10-CM | POA: Diagnosis present

## 2022-03-05 DIAGNOSIS — N12 Tubulo-interstitial nephritis, not specified as acute or chronic: Secondary | ICD-10-CM

## 2022-03-05 DIAGNOSIS — M069 Rheumatoid arthritis, unspecified: Secondary | ICD-10-CM | POA: Diagnosis present

## 2022-03-05 DIAGNOSIS — O99113 Other diseases of the blood and blood-forming organs and certain disorders involving the immune mechanism complicating pregnancy, third trimester: Secondary | ICD-10-CM | POA: Diagnosis present

## 2022-03-05 DIAGNOSIS — M329 Systemic lupus erythematosus, unspecified: Secondary | ICD-10-CM | POA: Diagnosis not present

## 2022-03-05 DIAGNOSIS — D6862 Lupus anticoagulant syndrome: Secondary | ICD-10-CM | POA: Diagnosis present

## 2022-03-05 DIAGNOSIS — Z3A35 35 weeks gestation of pregnancy: Secondary | ICD-10-CM | POA: Diagnosis not present

## 2022-03-05 DIAGNOSIS — B9620 Unspecified Escherichia coli [E. coli] as the cause of diseases classified elsewhere: Secondary | ICD-10-CM | POA: Diagnosis present

## 2022-03-05 DIAGNOSIS — O0990 Supervision of high risk pregnancy, unspecified, unspecified trimester: Secondary | ICD-10-CM | POA: Diagnosis not present

## 2022-03-05 DIAGNOSIS — O4700 False labor before 37 completed weeks of gestation, unspecified trimester: Secondary | ICD-10-CM

## 2022-03-05 DIAGNOSIS — O34219 Maternal care for unspecified type scar from previous cesarean delivery: Secondary | ICD-10-CM | POA: Diagnosis present

## 2022-03-05 DIAGNOSIS — O09891 Supervision of other high risk pregnancies, first trimester: Secondary | ICD-10-CM | POA: Diagnosis not present

## 2022-03-05 DIAGNOSIS — Z3A33 33 weeks gestation of pregnancy: Secondary | ICD-10-CM

## 2022-03-05 DIAGNOSIS — O0993 Supervision of high risk pregnancy, unspecified, third trimester: Secondary | ICD-10-CM

## 2022-03-05 DIAGNOSIS — Z20822 Contact with and (suspected) exposure to covid-19: Secondary | ICD-10-CM | POA: Diagnosis present

## 2022-03-05 DIAGNOSIS — O99891 Other specified diseases and conditions complicating pregnancy: Secondary | ICD-10-CM

## 2022-03-05 DIAGNOSIS — O2303 Infections of kidney in pregnancy, third trimester: Principal | ICD-10-CM | POA: Diagnosis present

## 2022-03-05 DIAGNOSIS — O4703 False labor before 37 completed weeks of gestation, third trimester: Secondary | ICD-10-CM | POA: Diagnosis present

## 2022-03-05 DIAGNOSIS — M545 Low back pain, unspecified: Secondary | ICD-10-CM | POA: Diagnosis not present

## 2022-03-05 LAB — URINALYSIS, ROUTINE W REFLEX MICROSCOPIC
Bilirubin Urine: NEGATIVE
Glucose, UA: NEGATIVE mg/dL
Ketones, ur: NEGATIVE mg/dL
Nitrite: NEGATIVE
Specific Gravity, Urine: 1.014 (ref 1.005–1.030)
pH: 7 (ref 5.0–8.0)

## 2022-03-05 LAB — COMPREHENSIVE METABOLIC PANEL
ALT: 7 U/L (ref 0–44)
AST: 17 U/L (ref 15–41)
Albumin: 3.7 g/dL (ref 3.5–5.0)
Alkaline Phosphatase: 81 U/L (ref 38–126)
Anion gap: 13 (ref 5–15)
BUN: 7 mg/dL (ref 6–20)
CO2: 17 mmol/L — ABNORMAL LOW (ref 22–32)
Calcium: 9.3 mg/dL (ref 8.9–10.3)
Chloride: 102 mmol/L (ref 98–111)
Creatinine, Ser: 0.55 mg/dL (ref 0.44–1.00)
GFR, Estimated: >60 mL/min (ref >60)
Glucose, Bld: 74 mg/dL (ref 70–99)
Potassium: 3.5 mmol/L (ref 3.5–5.1)
Sodium: 132 mmol/L — ABNORMAL LOW (ref 135–145)
Total Bilirubin: 0.5 mg/dL (ref 0.3–1.2)
Total Protein: 8.8 g/dL — ABNORMAL HIGH (ref 6.5–8.1)

## 2022-03-05 LAB — CBC
HCT: 34.4 % — ABNORMAL LOW (ref 36.0–46.0)
Hemoglobin: 11.6 g/dL — ABNORMAL LOW (ref 12.0–15.0)
MCH: 29.9 pg (ref 26.0–34.0)
MCHC: 33.7 g/dL (ref 30.0–36.0)
MCV: 88.7 fL (ref 80.0–100.0)
Platelets: 286 10*3/uL (ref 150–400)
RBC: 3.88 MIL/uL (ref 3.87–5.11)
RDW: 13.5 % (ref 11.5–15.5)
WBC: 15 10*3/uL — ABNORMAL HIGH (ref 4.0–10.5)
nRBC: 0 % (ref 0.0–0.2)

## 2022-03-05 LAB — TYPE AND SCREEN
ABO/RH(D): O POS
Antibody Screen: NEGATIVE

## 2022-03-05 LAB — LIPASE, BLOOD: Lipase: 22 U/L (ref 11–51)

## 2022-03-05 LAB — RESP PANEL BY RT-PCR (FLU A&B, COVID) ARPGX2
Influenza A by PCR: NEGATIVE
Influenza B by PCR: NEGATIVE
SARS Coronavirus 2 by RT PCR: NEGATIVE

## 2022-03-05 MED ORDER — SODIUM CHLORIDE 0.9 % IV SOLN: INTRAVENOUS | Status: DC

## 2022-03-05 MED ORDER — DOCUSATE SODIUM 100 MG PO CAPS: 100.0000 mg | ORAL_CAPSULE | Freq: Two times a day (BID) | ORAL | Status: DC | PRN

## 2022-03-05 MED ORDER — MAGNESIUM SULFATE 40 GM/1000ML IV SOLN
2.0000 g/h | INTRAVENOUS | Status: AC
  Administered 2022-03-05 – 2022-03-06 (×2): 2 g/h via INTRAVENOUS
  Filled 2022-03-05 (×2): qty 1000

## 2022-03-05 MED ORDER — SODIUM CHLORIDE 0.9 % IV BOLUS: 500.0000 mL | Freq: Once | INTRAVENOUS | Status: DC

## 2022-03-05 MED ORDER — SODIUM CHLORIDE 0.9 % IV SOLN
1.0000 g | Freq: Once | INTRAVENOUS | Status: AC
  Administered 2022-03-05: 1 g via INTRAVENOUS
  Filled 2022-03-05: qty 10

## 2022-03-05 MED ORDER — AZATHIOPRINE 50 MG PO TABS
50.0000 mg | ORAL_TABLET | Freq: Every day | ORAL | Status: DC
  Administered 2022-03-05 – 2022-03-08 (×4): 50 mg via ORAL
  Filled 2022-03-05 (×5): qty 1

## 2022-03-05 MED ORDER — SODIUM CHLORIDE 0.9 % IV SOLN
2.0000 g | INTRAVENOUS | Status: DC
  Administered 2022-03-06 – 2022-03-07 (×2): 2 g via INTRAVENOUS
  Filled 2022-03-05 (×2): qty 20

## 2022-03-05 MED ORDER — HYDROXYCHLOROQUINE SULFATE 200 MG PO TABS
200.0000 mg | ORAL_TABLET | Freq: Every day | ORAL | Status: DC
  Administered 2022-03-05 – 2022-03-08 (×4): 200 mg via ORAL
  Filled 2022-03-05 (×5): qty 1

## 2022-03-05 MED ORDER — CALCIUM CARBONATE ANTACID 500 MG PO CHEW
2.0000 | CHEWABLE_TABLET | ORAL | Status: DC | PRN
  Administered 2022-03-06: 400 mg via ORAL
  Filled 2022-03-05: qty 2

## 2022-03-05 MED ORDER — LACTATED RINGERS IV SOLN: INTRAVENOUS | Status: DC

## 2022-03-05 MED ORDER — BETAMETHASONE SOD PHOS & ACET 6 (3-3) MG/ML IJ SUSP
12.0000 mg | INTRAMUSCULAR | Status: AC
  Administered 2022-03-05 – 2022-03-06 (×2): 12 mg via INTRAMUSCULAR
  Filled 2022-03-05 (×2): qty 5

## 2022-03-05 MED ORDER — ENOXAPARIN SODIUM 40 MG/0.4ML IJ SOSY: 40.0000 mg | PREFILLED_SYRINGE | INTRAMUSCULAR | Status: DC

## 2022-03-05 MED ORDER — AZATHIOPRINE 50 MG PO TABS: 50.0000 mg | ORAL_TABLET | Freq: Every day | ORAL | Status: DC

## 2022-03-05 MED ORDER — ACETAMINOPHEN 500 MG PO TABS
1000.0000 mg | ORAL_TABLET | Freq: Once | ORAL | Status: DC
  Filled 2022-03-05: qty 2

## 2022-03-05 MED ORDER — ACETAMINOPHEN 500 MG PO TABS
500.0000 mg | ORAL_TABLET | Freq: Once | ORAL | Status: AC
Start: 2022-03-05 — End: 2022-03-05
  Administered 2022-03-05: 500 mg via ORAL

## 2022-03-05 MED ORDER — ONDANSETRON HCL 4 MG/2ML IJ SOLN
4.0000 mg | Freq: Once | INTRAMUSCULAR | Status: AC
  Administered 2022-03-05: 4 mg via INTRAVENOUS
  Filled 2022-03-05: qty 2

## 2022-03-05 MED ORDER — SODIUM CHLORIDE 0.9 % IV SOLN
1.0000 g | Freq: Once | INTRAVENOUS | Status: AC
  Administered 2022-03-05: 1 g via INTRAVENOUS

## 2022-03-05 MED ORDER — PRENATAL MULTIVITAMIN CH
1.0000 | ORAL_TABLET | Freq: Every day | ORAL | Status: DC
  Administered 2022-03-06 – 2022-03-07 (×2): 1 via ORAL
  Filled 2022-03-05 (×2): qty 1

## 2022-03-05 MED ORDER — ACETAMINOPHEN 325 MG PO TABS
650.0000 mg | ORAL_TABLET | ORAL | Status: DC | PRN
Start: 2022-03-05 — End: 2022-03-08
  Administered 2022-03-05 – 2022-03-08 (×6): 650 mg via ORAL
  Filled 2022-03-05 (×6): qty 2

## 2022-03-05 MED ORDER — OXYCODONE HCL 5 MG PO TABS
5.0000 mg | ORAL_TABLET | ORAL | Status: DC | PRN
  Administered 2022-03-05 – 2022-03-08 (×7): 10 mg via ORAL
  Filled 2022-03-05 (×7): qty 2

## 2022-03-05 MED ORDER — ONDANSETRON HCL 4 MG/2ML IJ SOLN
4.0000 mg | Freq: Three times a day (TID) | INTRAMUSCULAR | Status: DC | PRN
  Administered 2022-03-05: 4 mg via INTRAVENOUS
  Filled 2022-03-05: qty 2

## 2022-03-05 MED ORDER — SODIUM CHLORIDE 0.9 % IV BOLUS
500.0000 mL | Freq: Once | INTRAVENOUS | Status: AC
  Administered 2022-03-05: 500 mL via INTRAVENOUS

## 2022-03-05 MED ORDER — HYDROXYCHLOROQUINE SULFATE 200 MG PO TABS: 200.0000 mg | ORAL_TABLET | Freq: Every day | ORAL | Status: DC

## 2022-03-05 MED ORDER — ONDANSETRON HCL 4 MG PO TABS: 4.0000 mg | ORAL_TABLET | Freq: Three times a day (TID) | ORAL | Status: DC | PRN

## 2022-03-05 MED ORDER — SODIUM CHLORIDE 0.9 % IV BOLUS: 1000.0000 mL | Freq: Once | INTRAVENOUS | Status: DC

## 2022-03-05 MED ORDER — MAGNESIUM SULFATE BOLUS VIA INFUSION
6.0000 g | Freq: Once | INTRAVENOUS | Status: AC
  Administered 2022-03-05: 6 g via INTRAVENOUS
  Filled 2022-03-05: qty 1000

## 2022-03-05 NOTE — Progress Notes (Signed)
FHR at 1737 - Baseline 145, moderate variability, accels, no decels. No uc's tracing. ?

## 2022-03-05 NOTE — ED Notes (Signed)
Report given to Onalee Hua with [facility], reports they will be here in about 25 minutes. MAU Jolynn, RN given report, denies any further questions at this time.  ?

## 2022-03-05 NOTE — Progress Notes (Signed)
[facility] Pt is a G3P4 at 35 1/[redacted] weeks gestation presenting with c/o lower abd pain and back pain that started at 1100 today.  Denies vaginal bleeding or leaking of fluid. Pt had a set of twins. She [facility] and is scheduled for induction at 37 weeks because she has lupus.  ?

## 2022-03-05 NOTE — ED Notes (Signed)
Signature pad not working in room. Patient is [facility]. Patient is agreeable to transfer at this time. Patient is alert and oriented and has been in contact with family regarding plan of care.  ?

## 2022-03-05 NOTE — Progress Notes (Signed)
Dr. Nelda Marseille is having the pt transferred to [facility] for further evaluation. She is concerned that the pt may have pyelo and will need IV antibiotics. ?

## 2022-03-05 NOTE — H&P (Addendum)
Obstetrics Admission History & Physical  ?03/05/2022 - 8:59 PM ?Primary OBGYN: [facility] ? ?Chief Complaint: low back and abdominal pain.   ?History of Present Illness  ?31 y.o. [id number] @ exact GA unknown (between 32-34wks) with the above CC. Pregnancy complicated by: SLE, RA, h/o c-section x 1. ? ?Ms. Claire Wood states that starting today she started feeling her above s/s. No prior h/o this. No VB or LOF. [facility] and had a temp of 38.0 and concern for pyelo. Dr. Charlotta Newtonzan called and [facility]. Patient directed admitted based on chart review ? ?Patient not feeling intermittent discomfort but states that it is there constantly.  ? ?Review of Systems:  as noted in the History of Present Illness. ? ?Patient Active Problem List  ? Diagnosis Date Noted  ? Supervision of high risk pregnancy, antepartum 03/05/2022  ? Pyelonephritis affecting pregnancy 03/05/2022  ? 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 ?PMHx:  ?Past Medical History:  ?Diagnosis Date  ? Lupus (HCC)   ? RA (rheumatoid arthritis) (HCC)   ? ?PSHx:  ?Past Surgical History:  ?Procedure Laterality Date  ? CESAREAN SECTION    ? ?Medications:  ?PNV ?HCQ 200 qday ?Imuran 50 qday ? ?Allergies: is allergic to prednisone. ?OBHx:  ?OB History  ?Gravida Para Term Preterm AB Living  ?4 3 0 3 0 2  ?SAB IAB Ectopic Multiple Live Births  ?0 0 0 1 3  ?  ?# Outcome Date GA Lbr Len/2nd Weight Sex Delivery Anes PTL Lv  ?4 Current           ?3 Preterm           ?2 Preterm           ?1 Preterm           ? ?    ?FHx: History reviewed. No pertinent family history. ?Soc Hx:  ?Social History  ? ?Socioeconomic History  ? Marital status: Single  ?  Spouse name: Not on file  ? Number of children: Not on file  ? Years of education: Not on file  ? Highest education level: Not on file  ?Occupational History  ? Not on file  ?Tobacco Use  ? Smoking status: Never  ? Smokeless tobacco: Never   ?Vaping Use  ? Vaping Use: Never used  ?Substance and Sexual Activity  ? Alcohol use: Not Currently  ? Drug use: Not Currently  ? Sexual activity: Yes  ?Other Topics Concern  ? Not on file  ?Social History Narrative  ? Not on file  ? ?Social Determinants of Health  ? ?Financial Resource Strain: Not on file  ?Food Insecurity: Not on file  ?Transportation Needs: Not on file  ?Physical Activity: Not on file  ?Stress: Not on file  ?Social Connections: Not on file  ?Intimate Partner Violence: Not on file  ? ? ?Objective  ? ?Patient Vitals for the past 24 hrs: ? BP Temp Temp src Pulse Resp SpO2 Height Weight  ?03/05/22 1900 119/84 99.3 ?F (37.4 ?C) -- -- -- -- -- --  ?03/05/22 1800 122/78 99.5 ?F (37.5 ?C) Oral 98 (!) 21 98 % -- --  ?03/05/22 1745 -- -- -- 98 16 99 % -- --  ?03/05/22 1730 -- -- -- (!) 102 16 100 % -- --  ?03/05/22 1715 114/84 -- -- (!) 102 17 100 % -- --  ?03/05/22 1700 126/89 -- -- (!) 102 14 100 % -- --  ?  03/05/22 1645 122/82 -- -- (!) 102 (!) 30 100 % -- --  ?03/05/22 1635 -- (!) 100.4 ?F (38 ?C) Oral -- -- -- -- --  ?03/05/22 1634 (!) 121/95 -- -- (!) 111 20 100 % -- --  ?03/05/22 1631 -- -- -- -- -- -- 5\' 4"  (1.626 m) 65.8 kg  ? ?EFM: 140 baseline, +accels, no decel, mod variability  ?Toco: +irritability and occasional UCs ? ?General: Well nourished, well developed female in no acute distress.  ?Skin:  Warm and dry.  ?Back: no CVAT ?Cardiovascular: S1, S2 normal, no murmur, rub or gallop, regular rate and rhythm ?Respiratory:  Clear to auscultation bilateral. Normal respiratory effort ?Abdomen: gravid, nttp ?Neuro/Psych:  Normal mood and affect.  ? ?SVE: 2-3/50/-3/bow intact ? ?Labs  ?Recent Labs  ?Lab 03/05/22 ?1633  ?WBC 15.0*  ?HGB 11.6*  ?HCT 34.4*  ?PLT 286  ? ? ?Recent Labs  ?Lab 03/05/22 ?1633  ?NA 132*  ?K 3.5  ?CL 102  ?CO2 17*  ?BUN 7  ?CREATININE 0.55  ?CALCIUM 9.3  ?PROT 8.8*  ?BILITOT 0.5  ?ALKPHOS 81  ?ALT 7  ?AST 17  ?GLUCOSE 74  ? ? ?Radiology ?Renal u/s negative; ?37mm non  obstructing left stone in the midportion of the kidney vs renal sinus fat ? ?Prelim mfm u/s: 16%, 2019gm, ac 17%, afi 13.4, cephalic, with EDC of 5/17 at 97/0 today ? ?Assessment & Plan  ? 31 y.o. [id number] @ [redacted]w[redacted]d with pyelo and/or PTL. Pt stable ?*Pregnancy: fetus category I with accels ?*Pyelo: continue rocephin qday. F/u pending ucx ?*?PTL: EDC in care everywhere shows her to be either 33/5 with EDC of 5/17 per last [facility] note or 34/3 based on dating at [facility]. I asked her what due date was she using and she states that the plan at [facility] was to deliver her on 5/7 when she is 37wks which would make her 32/1 today. I will try and shed more liight on this but will err on the side of caution and do betamethasone and Mg for tocolysis. Plan to recheck in a few hours.  ?*Rheum: pt confirms on imuran and hcq. I don't see ssa/ssb and APL baseline labs in the system so this was ordered.  ?*h/o c-section: d/w pt again (d/w her at [facility] visits) and she desires tolac if in PTL.  ?*GBS: swab and GC/CT swab sent ?*PPx: SCDs ?*Analgesia: PO PRNs ? ? ?Total time taking care of the patient was 60 minutes, with greater than 50% of the time spent in face to face interaction with the patient. ? ? ?Cornelia Copa MD ?Attending ?[facility] [employer] [facility]) ?GYN Consult Phone: [phone] (M-F, [phone]) & [phone] (Off hours, weekends, holidays) ? ? ?

## 2022-03-05 NOTE — Progress Notes (Signed)
Dr. Charlotta Newton is on the unit and is reviewing the FHR tracing.She has reveiwed the U/A results and is speaking with the attending there. ?

## 2022-03-05 NOTE — ED Notes (Addendum)
RN called [facility] and spoke to [place], Therapist, sports. Patient history and pertinent concerns reviewed with [facility]. Patient is being seen at [facility] for her pregnancy care. MD Belfi made aware. This is her 3rd pregnancy and 4th child (Twins with one fetal loss in a previous pregnancy). Patient had lupus and is anticipated to be induced at 37 weeks.  ?

## 2022-03-05 NOTE — ED Provider Notes (Addendum)
?[facility] EMERGENCY DEPT ?Provider Note ? ? ?CSN: [id number] ?Arrival date & time: 03/05/22  1612 ? ?  ? ?History ? ?Chief Complaint  ?Patient presents with  ? Abdominal Pain  ? ? ?Claire Wood is a 31 y.o. female. ? ?Patient is a 31 year old female who presents with possible contractions with abdominal and back pain.  She is in her late term pregnancy.  She states that she is [redacted] weeks pregnant.  However on chart review, her due date is calculated at 04/18/22 which puts her at [redacted]w[redacted]d.  Today she started having some what she feels like her contraction type pains with pain in her lower back and across her abdomen.  She said she is only had about every 30 minutes.  She is currently having 1 as were talking to her but the last one was about 30 minutes ago.  This is her third pregnancy.  She is followed by [facility] high risk pregnancy group due to her underlying lupus and rheumatoid arthritis.  She is on immunosuppressants. ? ? ?  ? ?Home Medications ?Prior to Admission medications   ?Medication Sig Start Date End Date Taking? Authorizing Provider  ?docusate sodium (COLACE) 100 MG capsule Take 100 mg by mouth daily as needed for mild constipation.    [provider]  ?hydroxychloroquine (PLAQUENIL) 200 MG tablet Take 200 mg by mouth daily. 02/23/20   [provider]  ?ibuprofen (ADVIL) 200 MG tablet Take 400 mg by mouth every 6 (six) hours as needed for mild pain.    [provider]  ?predniSONE (DELTASONE) 50 MG tablet Take 1 tablet (50 mg total) by mouth daily. ?Patient not taking: No sig reported 08/23/21   Linwood Dibbles, MD  ?Prenatal Vit-Fe Fumarate-FA (PNV PRENATAL PLUS MULTIVITAMIN) 27-1 MG TABS Take 1 tablet by mouth daily.    [provider]  ?   ? ?Allergies    ?Prednisone   ? ?Review of Systems   ?Review of Systems  ?Constitutional:  Negative for chills, diaphoresis, fatigue and fever.  ?HENT:  Negative for congestion, rhinorrhea and sneezing.   ?Eyes:  Negative.   ?Respiratory:  Negative for cough, chest tightness and shortness of breath.   ?Cardiovascular:  Negative for chest pain and leg swelling.  ?Gastrointestinal:  Negative for abdominal pain, blood in stool, diarrhea, nausea and vomiting.  ?Genitourinary:  Negative for difficulty urinating, flank pain, frequency and hematuria.  ?Musculoskeletal:  Negative for arthralgias and back pain.  ?Skin:  Negative for rash.  ?Neurological:  Negative for dizziness, speech difficulty, weakness, numbness and headaches.  ? ?Physical Exam ?Updated Vital Signs ?BP 114/84   Pulse 98   Temp (!) 100.4 ?F (38 ?C) (Oral)   Resp 16   Ht  (1.626 m)   Wt 65.8 kg   LMP 07/12/2021 (Approximate) Comment: Patients abdomen shielded for imaging.  SpO2 99%   BMI 24.89 kg/m?  ?Physical Exam ?Awake and alert.  No acute distress ?Skin warm and dry ?HEENT unremarkable ?Lungs clear to auscultation bilaterally ?Heart sounds normal ?Abdomen:gravid uterus, no tenderness ?Legs: no swelling ? ?ED Results / Procedures / Treatments   ?Labs ?(all labs ordered are listed, but only abnormal results are displayed) ?Labs Reviewed  ?CBC - Abnormal; Notable for the following components:  ?    Result Value  ? WBC 15.0 (*)   ? Hemoglobin 11.6 (*)   ? HCT 34.4 (*)   ? All other components within normal limits  ?URINALYSIS, ROUTINE W REFLEX MICROSCOPIC -  Abnormal; Notable for the following components:  ? APPearance HAZY (*)   ? Hgb urine dipstick LARGE (*)   ? Protein, ur TRACE (*)   ? Leukocytes,Ua LARGE (*)   ? Bacteria, UA RARE (*)   ? All other components within normal limits  ?RESP PANEL BY RT-PCR (FLU A&B, COVID) ARPGX2  ?URINE CULTURE  ?LIPASE, BLOOD  ?COMPREHENSIVE METABOLIC PANEL  ? ? ?EKG ?None ? ?Radiology ?No results found. ? ?Procedures ?Procedures  ? ? ?Medications Ordered in ED ?Medications  ?0.9 %  sodium chloride infusion ( Intravenous New Bag/Given 03/05/22 1701)  ?cefTRIAXone (ROCEPHIN) 1 g in sodium chloride 0.9 % 100 mL IVPB (1 g  Intravenous New Bag/Given 03/05/22 1738)  ?sodium chloride 0.9 % bolus 500 mL (0 mLs Intravenous Stopped 03/05/22 1741)  ?acetaminophen (TYLENOL) tablet 500 mg (500 mg Oral Given 03/05/22 1705)  ?ondansetron [facility](ZOFRAN) injection 4 mg (4 mg Intravenous Given 03/05/22 1709)  ? ? ?ED Course/ Medical Decision Making/ A&P ?  ?                        ?Medical Decision Making ?Amount and/or Complexity of Data Reviewed ?Labs: ordered. ? ?Risk ?OTC drugs. ?Prescription drug management. ? ? ?Patient is a 31 year old female who is approximately 1633 weeks pregnant who presents with some abdominal and back pain which she is concerned about contractions.  Sterile bimanual exam was performed.  There is no fetal presentation noted in the vault.  Her cervix appears to be 2 cm dilated.  Her labs show an elevated white count.  She is noted to have a fever here in the ED.  Her urine appears consistent with infection.  I am concerned about pyelonephritis.  She was started on IV Rocephin.  Her urine was sent for culture.  I discussed the patient with the OB on-call, Dr.Ozan.  On the monitor, they do not appreciate any contractions.  The baby looks good.  However given the concerns for pyelonephritis, will transfer patient to the MAU per Dr.Ozan for admission and further treatment. ? ?Final Clinical Impression(s) / ED Diagnoses ?Final diagnoses:  ?Pyelonephritis affecting pregnancy in third trimester  ? ? ?Rx / DC Orders ?ED Discharge Orders   ? ? None  ? ?  ? ? ?  ?Rolan BuccoBelfi, Juandedios Dudash, MD ?03/05/22 1753 ? ?  ?Rolan BuccoBelfi, Christyanna Mckeon, MD ?03/21/22 1603 ? ?

## 2022-03-05 NOTE — Progress Notes (Signed)
Pt transferred to [facility] MAU by [place]. Unable to assess FHR tracing in order to document FHR.  ?

## 2022-03-05 NOTE — ED Notes (Signed)
Care handoff given to [facility], RN ?[facility] leaving at this time with patient.  ?

## 2022-03-05 NOTE — ED Triage Notes (Addendum)
Pt arrives to ED with c/o abdominal pain. The abdominal pain started at 11am today. Pt reports she is [redacted] weeks pregnant and is getting induced at 37 weeks. Pt reports possible contractions today. Pt goes to [facility] for OB care.   ?

## 2022-03-05 NOTE — ED Notes (Addendum)
500mg  tylenol administered as patient reports she recently took 1 tylenol extra strength around 1730. Patient is endorsing nausea at this time as well. MD made aware of patient's nausea.  ?

## 2022-03-06 DIAGNOSIS — O0990 Supervision of high risk pregnancy, unspecified, unspecified trimester: Secondary | ICD-10-CM

## 2022-03-06 MED ORDER — SODIUM CHLORIDE 0.9 % IV BOLUS
500.0000 mL | Freq: Once | INTRAVENOUS | Status: AC
  Administered 2022-03-06: 500 mL via INTRAVENOUS

## 2022-03-06 MED ORDER — FAMOTIDINE 20 MG PO TABS
20.0000 mg | ORAL_TABLET | Freq: Two times a day (BID) | ORAL | Status: DC
  Administered 2022-03-06 – 2022-03-08 (×5): 20 mg via ORAL
  Filled 2022-03-06 (×5): qty 1

## 2022-03-06 NOTE — Progress Notes (Signed)
OB Note ?Discomfort stable, no VB. Fetus still category I with accels. UCs more irregular. SVE unchanged at 2-3/50/-3, no blood on glove ?Continue Mg, BMZ #2 @ 2200 ? ?Cornelia Copaharlie Oveta Idris, [name] MD ?Attending ?[facility](Faculty Practice) ?03/06/2022 ?Time: 0215 ? ?

## 2022-03-06 NOTE — Progress Notes (Signed)
Daily Antepartum Note  ?Admission Date: 03/05/2022 ?Current Date: 03/06/2022 ?7:44 AM ? ?Claire Wood is a 31 y.o. [id number] @ 32-[redacted]wks GA, HD#2, admitted for pyelo vs PTL. ? ?Pregnancy complicated by: ?Patient Active Problem List  ? Diagnosis Date Noted  ? Supervision of high risk pregnancy, antepartum 03/05/2022  ? Pyelonephritis affecting pregnancy 03/05/2022  ? 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 Preterm uterine contractions in third trimester, antepartum 03/05/2022  ? ? ?Overnight/24hr events:  ?See prior notes ? ?Subjective:  ?Discomfort in bottom area is stable. No VB or LOF ? ?Objective:  ? ? Current Vital Signs 24h Vital Sign Ranges  ?T 99.3 ?F (37.4 ?C) Temp  Avg: 99.7 ?F (37.6 ?C)  Min: 99.3 ?F (37.4 ?C)  Max: 100.4 ?F (38 ?C)  ?BP 105/68 BP  Min: 100/67  Max: 131/85  ?HR 78 Pulse  Avg: 91.8  Min: 75  Max: 111  ?RR 17 Resp  Avg: 17.9  Min: 14  Max: 30  ?SaO2 100 % Room Air SpO2  Avg: 99.8 %  Min: 98 %  Max: 100 %  ?    ? 24 Hour I/O Current Shift I/O  ?Time ?Ins ?Outs 04/03 0701 - 04/04 0700 ?In: 2720.2 [P.O.:720; I.V.:890.5] ?Out: 600 [Urine:600] No intake/output data recorded.  ? ?Patient Vitals for the past 24 hrs: ? BP Temp Temp src Pulse Resp SpO2 Height Weight  ?03/06/22 0700 -- -- -- -- 17 -- -- --  ?03/06/22 0603 -- -- -- -- 16 -- -- --  ?03/06/22 0452 105/68 -- -- 78 18 100 % -- --  ?03/06/22 0401 106/69 -- -- 75 17 100 % -- --  ?03/06/22 0301 109/68 -- -- 75 16 100 % -- --  ?03/06/22 0201 119/78 -- -- 76 18 100 % -- --  ?03/06/22 0106 100/67 -- -- 85 17 100 % -- --  ?03/06/22 0000 114/73 -- -- 91 18 100 % -- --  ?03/05/22 2301 121/77 -- -- 88 17 100 % -- --  ?03/05/22 2204 131/85 -- -- 95 17 -- -- --  ?03/05/22 2140 124/77 -- -- 90 17 -- -- --  ?03/05/22 1900 119/84 99.3 ?F (37.4 ?C) -- -- -- -- -- --  ?03/05/22 1800 122/78 99.5 ?F (37.5 ?C) Oral 98 (!) 21 98 % -- --  ?03/05/22 1745 -- -- -- 98 16 99 % -- --  ?03/05/22 1730 -- -- -- (!) 102 16  100 % -- --  ?03/05/22 1715 114/84 -- -- (!) 102 17 100 % -- --  ?03/05/22 1700 126/89 -- -- (!) 102 14 100 % -- --  ?03/05/22 1645 122/82 -- -- (!) 102 (!) 30 100 % -- --  ?03/05/22 1635 -- (!) 100.4 ?F (38 ?C) Oral -- -- -- -- --  ?03/05/22 1634 (!) 121/95 -- -- (!) 111 20 100 % -- --  ?03/05/22 1631 -- -- -- -- -- -- 5\' 4"  (1.626 m) 65.8 kg  ?UOP:>162mL/hr ? ?Fetal Heart Tones: 120 baseline, +accels, no decel, mod variability ?Tocometry: irregular UCs ? ?Physical exam: ?General: Well nourished, well developed female in no acute distress. ?Abdomen: gravid nttp ?Cardiovascular: S1, S2 normal, no murmur, rub or gallop, regular rate and rhythm ?Respiratory: CTAB ?Extremities: no clubbing, cyanosis or edema ?Skin: Warm and dry.  ? ?Medications: ?Current Facility-Administered Medications  ?Medication Dose Route Frequency Provider Last Rate Last Admin  ? 0.9 %  sodium chloride infusion   Intravenous Continuous [place] Bing, MD  75 mL/hr at 03/05/22 2136 Rate Change at 03/05/22 2136  ? acetaminophen (TYLENOL) tablet 650 mg  650 mg Oral Q4H PRN [place] BingPickens, Armandina Iman, MD   650 mg at 03/05/22 2136  ? azaTHIOprine (IMURAN) tablet 50 mg  50 mg Oral Daily [place] BingPickens, Liliya Fullenwider, MD   50 mg at 03/05/22 2209  ? betamethasone acetate-betamethasone sodium phosphate (CELESTONE) injection 12 mg  12 mg Intramuscular Q24 Hr x 2 [place] BingPickens, Santita Hunsberger, MD   12 mg at 03/05/22 2210  ? calcium carbonate (TUMS - dosed in mg elemental calcium) chewable tablet 400 mg of elemental calcium  2 tablet Oral Q4H PRN [place] BingPickens, Ajai Terhaar, MD      ? cefTRIAXone (ROCEPHIN) 2 g in sodium chloride 0.9 % 100 mL IVPB  2 g Intravenous Q24H [place] BingPickens, Terin Dierolf, MD      ? docusate sodium (COLACE) capsule 100 mg  100 mg Oral BID PRN [place] BingPickens, London Nonaka, MD      ? hydroxychloroquine (PLAQUENIL) tablet 200 mg  200 mg Oral Daily [place] BingPickens, Patton Rabinovich, MD   200 mg at 03/05/22 2209  ? magnesium sulfate 40 grams in SWI 1000 mL OB infusion  2 g/hr Intravenous Titrated [place] BingPickens, Jonmichael Beadnell, MD 50  mL/hr at 03/05/22 2157 2 g/hr at 03/05/22 2157  ? ondansetron (ZOFRAN) injection 4 mg  4 mg Intravenous Q8H PRN [place] BingPickens, Shaylynne Lunt, MD   4 mg at 03/05/22 2224  ? ondansetron (ZOFRAN) tablet 4 mg  4 mg Oral Q8H PRN [place] BingPickens, Jaiyon Wander, MD      ? oxyCODONE (Oxy IR/ROXICODONE) immediate release tablet 5-10 mg  5-10 mg Oral Q4H PRN [place] BingPickens, Yousef Huge, MD   10 mg at 03/06/22 0108  ? prenatal multivitamin tablet 1 tablet  1 tablet Oral Q1200  BingPickens, Roselene Gray, MD      ? ? ?Labs:  ?Ucx: pending ? ?Recent Labs  ?Lab 03/05/22 ?1633  ?WBC 15.0*  ?HGB 11.6*  ?HCT 34.4*  ?PLT 286  ? ? ?Recent Labs  ?Lab 03/05/22 ?1633  ?NA 132*  ?K 3.5  ?CL 102  ?CO2 17*  ?BUN 7  ?CREATININE 0.55  ?CALCIUM 9.3  ?PROT 8.8*  ?BILITOT 0.5  ?ALKPHOS 81  ?ALT 7  ?AST 17  ?GLUCOSE 74  ? ? ? ?Radiology:  ?No new imaging ?Prelim mfm u/s: 16%, 2019gm, ac 17%, afi 13.4, cephalic, with EDC of 5/17 at 84/633/5 on HD#1 ?Assessment & Plan:  ?Pt stable ?*Pregnancy: category I tracing with accels ?*Pyelo: continue rocephin. F/u ucx ?*PTL?: cx exam stable overnight, cephalic on ultrasound. Need to figure out exact GA. BMZ#2 due at 2200 tonight and can d/c Mg once that is given  ?*Preterm: start PCN for GBS pending. Consult NICU PRN ?*PPx: SCDs ?*FEN/GI: regular diet, MIVF ? ?[name], [name] MD ?Attending ?[facility](Faculty Practice) ?GYN Consult Phone: [phone] (M-F, [phone]) & [phone]  (Off hours, weekends, holidays) ? ?

## 2022-03-07 ENCOUNTER — Encounter (HOSPITAL_COMMUNITY): Payer: Self-pay | Admitting: Obstetrics and Gynecology

## 2022-03-07 DIAGNOSIS — O0990 Supervision of high risk pregnancy, unspecified, unspecified trimester: Secondary | ICD-10-CM | POA: Diagnosis not present

## 2022-03-07 LAB — SJOGRENS SYNDROME-B EXTRACTABLE NUCLEAR ANTIBODY: SSB (La) (ENA) Antibody, IgG: <0.2 AI (ref 0.0–0.9)

## 2022-03-07 LAB — SJOGRENS SYNDROME-A EXTRACTABLE NUCLEAR ANTIBODY: SSA (Ro) (ENA) Antibody, IgG: 4.8 AI — ABNORMAL HIGH (ref 0.0–0.9)

## 2022-03-07 LAB — URINE CULTURE: Culture: >=100000 — AB

## 2022-03-07 MED ORDER — CEFDINIR 300 MG PO CAPS: 300.0000 mg | ORAL_CAPSULE | Freq: Two times a day (BID) | ORAL | 0 refills | Status: AC

## 2022-03-07 NOTE — Progress Notes (Signed)
Patient ID: Claire Wood, female   DOB: 03-05-1991, 31 y.o.   MRN: [id number] ?FACULTY PRACTICE ANTEPARTUM(COMPREHENSIVE) NOTE ? ?Claire Wood is a 31 y.o. [phone] at [redacted]w[redacted]d by LMP, early ultrasound who is admitted for Preterm labor, Pyelonephritis.   ?Fetal presentation is cephalic. ?Length of Stay:  2  Days ? ?ASSESSMENT: ?Principal Problem: ?  Supervision of high risk pregnancy, antepartum ?Active Problems: ?  Pyelonephritis affecting pregnancy ?  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  Preterm uterine contractions in third trimester, antepartum ? ? ?PLAN: ?Supervision of high risk pregnancy, antepartum ? ?Pyelonephritis affecting pregnancy ?Continue Rocephin IV ?Urine cultures growing pansensitive E. coli patient has been afebrile for 48 hours ?Plan on discharge in the morning. ? 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 ?Rheumatoid Arthritis ?Continue Imuran ? ?Preterm uterine contractions in third trimester, antepartum ?Suspect she had some increased uterine activity related to her pyelonephritis ?She is status post betamethasone x2 ?Uterus is quiet at present ?Consider Procardia if needed ? ?Subjective: ?Patient is quite anxious today still having some pelvic pressure but denies uterine contractions.  She feels much better than on admission ?Patient reports the fetal movement as active. ?Patient reports uterine contraction  activity as none. ?Patient reports  vaginal bleeding as none. ?Patient describes fluid per vagina as None. ? ?Vitals:  Blood pressure 115/72, pulse 69, temperature 98.2 ?F (36.8 ?C), temperature source Oral, resp. rate 18, height 5\' 4"  (1.626 m), weight 65.8 kg, last menstrual period 07/12/2021, SpO2 100 %. ?Physical Examination: ? General appearance - alert, well appearing, and in no distress ?Chest - normal effort ?Abdomen - gravid, non-tender ?Fundal Height:  size equals dates ?Extremities:  extremities normal, atraumatic, no cyanosis or edema  ?Membranes:intact ? ?Fetal Monitoring:  Baseline: 125 bpm, Variability: Good {> 6 bpm), Accelerations: Reactive, and Decelerations: Absent ? ?Labs:  ?No results found for this or any previous visit (from the past 24 hour(s)). ? ?Imaging Studies:    ?Cephalic ?AFI is 13.46 ?EFW 2019 g, 4 pounds 7 ounces (16th percentile) ? ?Medications:  Scheduled ? azaTHIOprine  50 mg Oral Daily  ? famotidine  20 mg Oral BID  ? hydroxychloroquine  200 mg Oral Daily  ? prenatal multivitamin  1 tablet Oral Q1200  ? ?I have reviewed the patient's current medications. ? ? ?Reva Bores, MD ?03/07/2022,5:30 PM ? ?

## 2022-03-07 NOTE — Discharge Summary (Signed)
Antenatal Physician Discharge Summary  ?Patient ID: ?Claire Wood ?MRN: [id number] ?DOB/AGE: 32-Oct-1992 32 y.o. ? ?Admit date: 03/05/2022 ?Discharge date: 03/07/2022 ? ?Admission Diagnoses:Principal Problem: ?  Supervision of high risk pregnancy, antepartum ?Active Problems: ?  Pyelonephritis affecting pregnancy ?  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  Preterm uterine contractions in third trimester, antepartum ? ?Discharge Diagnoses: Same ? ?Prenatal Procedures: NST and ultrasound ? ?Consults: Neonatology, Maternal Fetal Medicine ? ?Hospital Course:  ?Claire Wood is a 31 y.o. [phone] with IUP at [redacted]w[redacted]d admitted for Pyelonephritis, temp to 38 C on 03/05/2022. Also noted to have  She was admitted with contractions, noted to have a cervical exam of 1-2 cm. Started on Magnesium x 24 hours, given BMZ x 2. Started on IV Rocephin. Remained afebrile. Urine culture grew pan-sensitive E. Coli. She was continued on her SLE and RA meds. She was observed, fetal heart rate monitoring remained reassuring, and she had no signs/symptoms of progressing preterm labor or other maternal-fetal concerns.  She was deemed stable for discharge to home with outpatient follow up. ? ?Discharge Exam: ?Temp:  [98 ?F (36.7 ?C)-98.2 ?F (36.8 ?C)] 98.2 ?F (36.8 ?C) (04/05 1536) ?Pulse Rate:  [69-77] 69 (04/05 1536) ?Resp:  [16-18] 18 (04/05 1536) ?BP: (108-122)/(68-81) 115/72 (04/05 1536) ?SpO2:  [100 %] 100 % (04/05 1536) ?Physical Examination: ?CONSTITUTIONAL: Well-developed, well-nourished female in no acute distress.  ?HENT:  Normocephalic, atraumatic, External right and left ear normal.  ?EYES: Conjunctivae and EOM are normal. No scleral icterus.  ?NECK: Normal range of motion, supple, no masses ?SKIN: Skin is warm and dry. No rash noted.  ?NEUROLOGIC: Alert and oriented to person, place, and time. Normal reflexes, muscle tone coordination.  ?PSYCHIATRIC: Normal mood and affect. Normal behavior. Normal  judgment and thought content. ?CARDIOVASCULAR: Normal heart rate noted, regular rhythm ?RESPIRATORY: Effort and breath sounds normal, no problems with respiration noted ?MUSCULOSKELETAL: Normal range of motion. No edema and no tenderness. 2+ distal pulses. ?ABDOMEN: Soft, nontender, nondistended, gravid. ?CERVIX: Dilation: 2.5 ?Effacement (%): 50 ?Station: -3 ?Presentation: Vertex ?Exam by:: dr  Ilda Basset ? ?Fetal monitoring: FHR: 130 bpm, Variability: moderate, Accelerations: Present, Decelerations: Absent  ?Uterine activity: quiet ? ?Significant Diagnostic Studies:  ?Results for orders placed or performed during the hospital encounter of 03/05/22 (from the past 168 hour(s))  ?Urine Culture  ? Collection Time: 03/05/22  4:33 PM  ? Specimen: Urine, Clean Catch  ?Result Value Ref Range  ? Specimen Description    ?  URINE, CLEAN CATCH ?Performed at [facility], [address], [place], [place] [address] ?  ? Special Requests    ?  NONE ?Performed at [facility], [address] [place], [place] [address] ?  ? Culture >=100,000 COLONIES/mL ESCHERICHIA COLI (A)   ? Report Status 03/07/2022 FINAL   ? Organism ID, Bacteria ESCHERICHIA COLI (A)   ?    Susceptibility  ? Escherichia coli - MIC*  ?  AMPICILLIN <=2 SENSITIVE Sensitive   ?  CEFAZOLIN <=4 SENSITIVE Sensitive   ?  CEFEPIME <=0.12 SENSITIVE Sensitive   ?  CEFTRIAXONE <=0.25 SENSITIVE Sensitive   ?  CIPROFLOXACIN <=0.25 SENSITIVE Sensitive   ?  GENTAMICIN <=1 SENSITIVE Sensitive   ?  IMIPENEM <=0.25 SENSITIVE Sensitive   ?  NITROFURANTOIN <=16 SENSITIVE Sensitive   ?  TRIMETH/SULFA <=20 SENSITIVE Sensitive   ?  AMPICILLIN/SULBACTAM <=2 SENSITIVE Sensitive   ?  PIP/TAZO <=4 SENSITIVE Sensitive   ?  * >=100,000 COLONIES/mL ESCHERICHIA COLI  ?  Lipase, blood  ? Collection Time: 03/05/22  4:33 PM  ?Result Value Ref Range  ? Lipase 22 11 - 51 U/L  ?Comprehensive metabolic panel  ? Collection Time: 03/05/22  4:33 PM  ?Result Value  Ref Range  ? Sodium 132 (L) 135 - 145 mmol/L  ? Potassium 3.5 3.5 - 5.1 mmol/L  ? Chloride 102 98 - 111 mmol/L  ? CO2 17 (L) 22 - 32 mmol/L  ? Glucose, Bld 74 70 - 99 mg/dL  ? BUN 7 6 - 20 mg/dL  ? Creatinine, Ser 0.55 0.44 - 1.00 mg/dL  ? Calcium 9.3 8.9 - 10.3 mg/dL  ? Total Protein 8.8 (H) 6.5 - 8.1 g/dL  ? Albumin 3.7 3.5 - 5.0 g/dL  ? AST 17 15 - 41 U/L  ? ALT 7 0 - 44 U/L  ? Alkaline Phosphatase 81 38 - 126 U/L  ? Total Bilirubin 0.5 0.3 - 1.2 mg/dL  ? GFR, Estimated >60 >60 mL/min  ? Anion gap 13 5 - 15  ?CBC  ? Collection Time: 03/05/22  4:33 PM  ?Result Value Ref Range  ? WBC 15.0 (H) 4.0 - 10.5 K/uL  ? RBC 3.88 3.87 - 5.11 MIL/uL  ? Hemoglobin 11.6 (L) 12.0 - 15.0 g/dL  ? HCT 34.4 (L) 36.0 - 46.0 %  ? MCV 88.7 80.0 - 100.0 fL  ? MCH 29.9 26.0 - 34.0 pg  ? MCHC 33.7 30.0 - 36.0 g/dL  ? RDW 13.5 11.5 - 15.5 %  ? Platelets 286 150 - 400 K/uL  ? nRBC 0.0 0.0 - 0.2 %  ?Urinalysis, Routine w reflex microscopic Urine, Clean Catch  ? Collection Time: 03/05/22  4:33 PM  ?Result Value Ref Range  ? Color, Urine YELLOW YELLOW  ? APPearance HAZY (A) CLEAR  ? Specific Gravity, Urine 1.014 1.005 - 1.030  ? pH 7.0 5.0 - 8.0  ? Glucose, UA NEGATIVE NEGATIVE mg/dL  ? Hgb urine dipstick LARGE (A) NEGATIVE  ? Bilirubin Urine NEGATIVE NEGATIVE  ? Ketones, ur NEGATIVE NEGATIVE mg/dL  ? Protein, ur TRACE (A) NEGATIVE mg/dL  ? Nitrite NEGATIVE NEGATIVE  ? Leukocytes,Ua LARGE (A) NEGATIVE  ? RBC / HPF 21-50 0 - 5 RBC/hpf  ? WBC, UA 6-10 0 - 5 WBC/hpf  ? Bacteria, UA RARE (A) NONE SEEN  ? Squamous Epithelial / LPF 0-5 0 - 5  ?Resp Panel by RT-PCR (Flu A&B, Covid) Nasopharyngeal Swab  ? Collection Time: 03/05/22  5:29 PM  ? Specimen: Nasopharyngeal Swab; Nasopharyngeal(NP) swabs in vial transport medium  ?Result Value Ref Range  ? SARS Coronavirus 2 by RT PCR NEGATIVE NEGATIVE  ? Influenza A by PCR NEGATIVE NEGATIVE  ? Influenza B by PCR NEGATIVE NEGATIVE  ?Sjogrens syndrome-B extractable nuclear antibody  ? Collection Time:  03/05/22  7:17 PM  ?Result Value Ref Range  ? SSB (La) (ENA) Antibody, IgG <0.2 0.0 - 0.9 AI  ?Sjogrens syndrome-A extractable nuclear antibody  ? Collection Time: 03/05/22  9:56 PM  ?Result Value Ref Range  ? SSA (Ro) (ENA) Antibody, IgG 4.8 (H) 0.0 - 0.9 AI  ?Type and screen [place]  ? Collection Time: 03/05/22  9:56 PM  ?Result Value Ref Range  ? ABO/RH(D) O POS   ? Antibody Screen NEG   ? Sample Expiration    ?  03/08/2022,2359 ?Performed at [facility], [place] [address] [place], [place] [address] ?  ?Culture, beta strep (group b only)  ? Collection Time:  03/05/22 10:00 PM  ? Specimen: Vaginal/Rectal; Genital  ?Result Value Ref Range  ? Specimen Description VAGINAL/RECTAL   ? Special Requests NONE   ? Culture    ?  NO GROUP B STREP (S.AGALACTIAE) ISOLATED ?Performed at [facility], [place] [address], [place], [place] [address] ?  ? Report Status PENDING   ? ?US RENAL ? ?Result Date: 03/05/2022 ?CLINICAL DATA:  Pyelonephritis EXAM: RENAL / URINARY TRACT ULTRASOUND COMPLETE COMPARISON:  None. FINDINGS: Right Kidney: Renal measurements: 10.6 x 4.4 x 5.4 cm = volume: 131.8 mL. Echogenicity within normal limits. No mass or hydronephrosis visualized. Left Kidney: Renal measurements: 11.1 x 5.3 x 5.5 cm = volume: 167.5 mL. There is no hydronephrosis. There is 3 mm hyperechoic focus in the midportion of right kidney. There is no acoustic shadowing. There is no perinephric fluid collection. Bladder: Urinary bladder is empty and not optimally evaluated. Other: None. IMPRESSION: There is no hydronephrosis. There is 3 mm hyperechoic focus in the midportion of left kidney. This may suggest a nonobstructing renal stone or partial volume averaging of renal sinus fat. Electronically Signed   By: Elmer Picker M.D.   On: 03/05/2022 20:35  ? ?[place] MFM OB DETAIL +14 WK ? ?Result Date: 03/06/2022 ?----------------------------------------------------------------------  OBSTETRICS REPORT                        (Signed Final 03/06/2022 02:05 pm) ---------------------------------------------------------------------- Patient Info  ID #:       [id number]                          D.O.B.:  1991-12-02 (30 yrs)  Name:

## 2022-03-08 LAB — ANTIPHOSPHOLIPID SYNDROME EVAL, BLD
Anticardiolipin IgA: <9 APL U/mL (ref 0–11)
Anticardiolipin IgG: <9 GPL U/mL (ref 0–14)
Anticardiolipin IgM: 10 MPL U/mL (ref 0–12)
DRVVT: 35 s (ref 0.0–47.0)
PTT Lupus Anticoagulant: 40.3 s (ref 0.0–43.5)
Phosphatydalserine, IgA: 1 APS Units (ref 0–19)
Phosphatydalserine, IgG: <9 Units (ref 0–30)
Phosphatydalserine, IgM: 76 Units — ABNORMAL HIGH (ref 0–30)

## 2022-03-10 LAB — CULTURE, BETA STREP (GROUP B ONLY)

## 2022-03-12 ENCOUNTER — Encounter
Admit: 2022-03-12 | Discharge: 2022-03-12 | Payer: MEDICAID | Attending: Maternal & Fetal Medicine | Primary: Maternal & Fetal Medicine

## 2022-03-12 ENCOUNTER — Ambulatory Visit
Admit: 2022-03-12 | Discharge: 2022-03-13 | Payer: MEDICAID | Attending: Maternal & Fetal Medicine | Primary: Maternal & Fetal Medicine

## 2022-03-12 ENCOUNTER — Ambulatory Visit: Admit: 2022-03-12 | Discharge: 2022-03-13 | Payer: MEDICAID

## 2022-03-12 DIAGNOSIS — O133 Gestational [pregnancy-induced] hypertension without significant proteinuria, third trimester: Secondary | ICD-10-CM | POA: Insufficient documentation

## 2022-03-12 DIAGNOSIS — O99891 Systemic lupus erythematosus affecting pregnancy (CMS-HCC): Principal | ICD-10-CM

## 2022-03-12 DIAGNOSIS — Z1389 Encounter for screening for other disorder: Principal | ICD-10-CM

## 2022-03-12 DIAGNOSIS — M329 Systemic lupus erythematosus, unspecified: Principal | ICD-10-CM

## 2022-03-12 DIAGNOSIS — O34219 Maternal care for unspecified type scar from previous cesarean delivery: Principal | ICD-10-CM

## 2022-03-12 DIAGNOSIS — O09893 Supervision of other high risk pregnancies, third trimester: Principal | ICD-10-CM

## 2022-03-12 DIAGNOSIS — O2300 Infections of kidney in pregnancy, unspecified trimester: Principal | ICD-10-CM

## 2022-03-12 DIAGNOSIS — O0993 Supervision of high risk pregnancy, unspecified, third trimester: Principal | ICD-10-CM

## 2022-03-19 ENCOUNTER — Ambulatory Visit: Admit: 2022-03-19 | Discharge: 2022-03-20 | Payer: MEDICAID | Attending: "Obstetric | Primary: "Obstetric

## 2022-03-19 ENCOUNTER — Encounter
Admit: 2022-03-19 | Discharge: 2022-03-19 | Payer: MEDICAID | Attending: Maternal & Fetal Medicine | Primary: Maternal & Fetal Medicine

## 2022-03-19 ENCOUNTER — Ambulatory Visit
Admit: 2022-03-19 | Discharge: 2022-03-20 | Payer: MEDICAID | Attending: Maternal & Fetal Medicine | Primary: Maternal & Fetal Medicine

## 2022-03-19 DIAGNOSIS — O09893 Supervision of other high risk pregnancies, third trimester: Principal | ICD-10-CM

## 2022-03-19 DIAGNOSIS — O133 Gestational [pregnancy-induced] hypertension without significant proteinuria, third trimester: Principal | ICD-10-CM

## 2022-03-19 DIAGNOSIS — O34219 Maternal care for unspecified type scar from previous cesarean delivery: Principal | ICD-10-CM

## 2022-03-19 DIAGNOSIS — O99891 Systemic lupus erythematosus affecting pregnancy (CMS-HCC): Principal | ICD-10-CM

## 2022-03-19 DIAGNOSIS — Z8759 Personal history of other complications of pregnancy, childbirth and the puerperium: Principal | ICD-10-CM

## 2022-03-19 DIAGNOSIS — M329 Systemic lupus erythematosus, unspecified: Principal | ICD-10-CM

## 2022-03-22 ENCOUNTER — Ambulatory Visit: Admit: 2022-03-22 | Discharge: 2022-03-24 | Disposition: A | Discharge status: Home / Self Care | Payer: MEDICAID

## 2022-03-22 ENCOUNTER — Encounter: Admit: 2022-03-22 | Discharge: 2022-03-24 | Disposition: A | Discharge status: Home / Self Care | Payer: MEDICAID

## 2022-03-24 DIAGNOSIS — Z3009 Encounter for other general counseling and advice on contraception: Principal | ICD-10-CM

## 2022-03-29 ENCOUNTER — Ambulatory Visit: Admit: 2022-03-29 | Discharge: 2022-03-30 | Payer: MEDICAID

## 2022-04-26 ENCOUNTER — Ambulatory Visit: Admit: 2022-04-26 | Discharge: 2022-04-27 | Payer: MEDICAID

## 2022-04-26 DIAGNOSIS — Z3009 Encounter for other general counseling and advice on contraception: Principal | ICD-10-CM

## 2022-04-27 ENCOUNTER — Other Ambulatory Visit: Admit: 2022-04-27 | Discharge: 2022-04-28 | Payer: MEDICAID | Attending: "Obstetric | Primary: "Obstetric

## 2022-04-27 DIAGNOSIS — O133 Gestational [pregnancy-induced] hypertension without significant proteinuria, third trimester: Principal | ICD-10-CM

## 2022-04-27 DIAGNOSIS — O165 Unspecified maternal hypertension, complicating the puerperium: Principal | ICD-10-CM

## 2022-04-27 DIAGNOSIS — Z1389 Encounter for screening for other disorder: Principal | ICD-10-CM

## 2022-04-27 MED ORDER — AMLODIPINE 5 MG-BENAZEPRIL 10 MG CAPSULE
ORAL_CAPSULE | Freq: Every day | ORAL | 5 refills | 30 days | Status: CP
Start: 2022-04-27 — End: 2022-10-24

## 2022-05-30 ENCOUNTER — Ambulatory Visit: Admit: 2022-05-30 | Payer: MEDICAID | Attending: Family | Primary: Family

## 2022-06-21 ENCOUNTER — Emergency Department (HOSPITAL_BASED_OUTPATIENT_CLINIC_OR_DEPARTMENT_OTHER)
Admission: EM | Admit: 2022-06-21 | Discharge: 2022-06-21 | Disposition: A | Discharge status: Home / Self Care | Payer: Medicaid Other | Attending: Emergency Medicine | Admitting: Emergency Medicine

## 2022-06-21 ENCOUNTER — Other Ambulatory Visit: Payer: Self-pay

## 2022-06-21 ENCOUNTER — Emergency Department (HOSPITAL_BASED_OUTPATIENT_CLINIC_OR_DEPARTMENT_OTHER): Payer: Medicaid Other | Admitting: Radiology

## 2022-06-21 ENCOUNTER — Encounter (HOSPITAL_BASED_OUTPATIENT_CLINIC_OR_DEPARTMENT_OTHER): Payer: Self-pay | Admitting: Emergency Medicine

## 2022-06-21 DIAGNOSIS — R079 Chest pain, unspecified: Secondary | ICD-10-CM | POA: Insufficient documentation

## 2022-06-21 MED ORDER — ALUM & MAG HYDROXIDE-SIMETH 200-200-20 MG/5ML PO SUSP
30.0000 mL | Freq: Once | ORAL | Status: AC
  Administered 2022-06-21: 30 mL via ORAL
  Filled 2022-06-21: qty 30

## 2022-06-21 MED ORDER — IBUPROFEN 800 MG PO TABS: 800.0000 mg | ORAL_TABLET | Freq: Once | ORAL | Status: DC

## 2022-06-21 MED ORDER — IBUPROFEN 800 MG PO TABS: 800.0000 mg | ORAL_TABLET | Freq: Three times a day (TID) | ORAL | 0 refills | Status: AC | PRN

## 2022-06-21 NOTE — ED Triage Notes (Signed)
Pt c/o central chest pain since last night 2100. Pt states that she thinks that the pain is either GERD or a lupus flare up

## 2022-06-21 NOTE — ED Provider Notes (Signed)
7:01 AM Patient signed out to me by previous ED physician. Pt is a 31 yo female presenting for chest pain.   Pending: GI cocktail and CXR   Physical Exam  BP (!) 137/99   Pulse 72   Temp 98.6 F (37 C)   Resp 17   Ht 5\' 4"  (1.626 m)   Wt 61.2 kg   LMP 03/22/2022 (Approximate) Comment: Patients abdomen shielded for imaging.  SpO2 99%   Breastfeeding Unknown Comment: pt states that she has bleeding since April  BMI 23.17 kg/m   Physical Exam Vitals and nursing note reviewed.  Constitutional:      General: She is not in acute distress.    Appearance: She is well-developed.  Cardiovascular:     Rate and Rhythm: Normal rate.  Pulmonary:     Effort: Pulmonary effort is normal. No respiratory distress.  Musculoskeletal:        General: No swelling.  Neurological:     Mental Status: She is alert.     Procedures  Procedures  ED Course / MDM    Medical Decision Making Amount and/or Complexity of Data Reviewed Radiology: ordered.  Risk OTC drugs.   7:48 AM CXR demonstrates no acute process. Pt otherwise stable with improvement of symptoms. Patient in no distress and overall condition improved here in the ED. Detailed discussions were had with the patient regarding current findings, and need for close f/u with PCP or on call doctor. The patient has been instructed to return immediately if the symptoms worsen in any way for re-evaluation. Patient verbalized understanding and is in agreement with current care plan. All questions answered prior to discharge.        May P, DO 06/21/22 [phone]

## 2022-06-21 NOTE — ED Notes (Signed)
Patient verbalizes understanding of discharge instructions. Opportunity for questioning and answers were provided. Patient discharged from ED.  °

## 2022-06-21 NOTE — Discharge Instructions (Signed)
You were evaluated in the Emergency Department and after careful evaluation, we did not find any emergent condition requiring admission or further testing in the hospital.  Your exam/testing today was overall reassuring.  EKG and chest xray were normal recommend continued follow up with your regular doctors.  Please return to the Emergency Department if you experience any worsening of your condition.  Thank you for allowing [place] to be a part of your care.

## 2022-06-21 NOTE — ED Provider Notes (Signed)
[facility] EMERGENCY [facility] Emergency Department Provider Note MRN:  [id number]  Arrival date & time: 06/21/22     Chief Complaint   Chest Pain   History of Present Illness   Claire Wood is a 32 y.o. year-old female with a history of lupus presenting to the ED with chief complaint of chest pain.  Central chest pain for the past day or so.  Worse with movement.  Described as a soreness.  Feels like either GERD or her lupus related pain.  No recent fever or cough, no leg pain or swelling, no abdominal pain.   Review of Systems  A thorough review of systems was obtained and all systems are negative except as noted in the HPI and PMH.   Patient's Health History    Past Medical History:  Diagnosis Date   Lupus (HCC)    RA (rheumatoid arthritis) (HCC)     Past Surgical History:  Procedure Laterality Date   CESAREAN SECTION      No family history on file.  Social History   Socioeconomic History   Marital status: Single    Spouse name: Not on file   Number of children: Not on file   Years of education: Not on file   Highest education level: Not on file  Occupational History   Not on file  Tobacco Use   Smoking status: Never   Smokeless tobacco: Never  Vaping Use   Vaping Use: Never used  Substance and Sexual Activity   Alcohol use: Not Currently   Drug use: Not Currently   Sexual activity: Yes  Other Topics Concern   Not on file  Social History Narrative   Not on file   Social Determinants of Health   Financial Resource Strain: Not on file  Food Insecurity: Not on file  Transportation Needs: Not on file  Physical Activity: Not on file  Stress: Not on file  Social Connections: Not on file  Intimate Partner Violence: Not on file     Physical Exam   Vitals:   06/21/22 0618  BP: (!) 133/99  Pulse: 78  Resp: 16  Temp: 98.6 F (37 C)  SpO2: 100%    CONSTITUTIONAL:  well-appearing, NAD NEURO/PSYCH:  Alert and oriented x 3, no focal  deficits EYES:  eyes equal and reactive ENT/NECK:  no LAD, no JVD CARDIO:  regular rate, well-perfused, normal S1 and S2 PULM:  CTAB no wheezing or rhonchi GI/GU:  non-distended, non-tender MSK/SPINE:  No gross deformities, no edema SKIN:  no rash, atraumatic   *Additional and/or pertinent findings included in MDM below  Diagnostic and Interventional Summary    EKG Interpretation  Date/Time:  Thursday June 21 2022 06:19:09 EDT Ventricular Rate:  75 PR Interval:  57 QRS Duration: 80 QT Interval:  382 QTC Calculation: 427 R Axis:   59 Text Interpretation: Sinus rhythm Short PR interval Borderline T abnormalities, anterior leads Confirmed by Kennis Carina [phone]) on 06/21/2022 6:20:32 AM       Labs Reviewed - No data to display  DG Chest 2 View    (Results Pending)    Medications  alum & mag hydroxide-simeth (MAALOX/MYLANTA) 200-200-20 MG/5ML suspension 30 mL (30 mLs Oral Given 06/21/22 1914)     Procedures  /  Critical Care Procedures  ED Course and Medical Decision Making  Initial Impression and Ddx Well appearing with normal VS, no increased WOB, clear lungs sitting comfortably.  Doubt PE.  Suspect MSK vs lupus.  No emergent process is  evident.  Will obtain screening EKG and CXR.  Past medical/surgical history that increases complexity of ED encounter:  lupus  Interpretation of Diagnostics I personally reviewed the EKG and my interpretation is as follows:  SR without significant change from prior  CXR appears normal.  Patient Reassessment and Ultimate Disposition/Management     Discharge.  Patient management required discussion with the following services or consulting groups:  None  Complexity of Problems Addressed Acute illness or injury that poses threat of life of bodily function  Additional Data Reviewed and Analyzed Further history obtained from: None  Additional Factors Impacting ED Encounter Risk None  Elmer Sow. Pilar Plate, MD [facility] Emergency  Medicine [facility] [email or web address]  Final Clinical Impressions(s) / ED Diagnoses     ICD-10-CM   1. Chest pain, unspecified type  R07.9       ED Discharge Orders     None        Discharge Instructions Discussed with and Provided to Patient:     Discharge Instructions      You were evaluated in the Emergency Department and after careful evaluation, we did not find any emergent condition requiring admission or further testing in the hospital.  Your exam/testing today was overall reassuring.  EKG and chest xray were normal recommend continued follow up with your regular doctors.  Please return to the Emergency Department if you experience any worsening of your condition.  Thank you for allowing [place] to be a part of your care.        Sabas Sous, MD 06/21/22 [phone]

## 2022-07-05 ENCOUNTER — Ambulatory Visit: Admit: 2022-07-05 | Payer: MEDICAID | Attending: Obstetrics & Gynecology | Primary: Obstetrics & Gynecology

## 2022-08-02 ENCOUNTER — Ambulatory Visit: Admit: 2022-08-02 | Discharge: 2022-08-03 | Payer: MEDICAID | Attending: Family | Primary: Family

## 2022-08-02 DIAGNOSIS — M1990 Unspecified osteoarthritis, unspecified site: Principal | ICD-10-CM

## 2022-08-02 DIAGNOSIS — M329 Systemic lupus erythematosus, unspecified: Principal | ICD-10-CM

## 2022-08-02 DIAGNOSIS — Z79631 Methotrexate, long term, current use: Principal | ICD-10-CM

## 2022-08-02 MED ORDER — HYDROXYCHLOROQUINE 200 MG TABLET
ORAL_TABLET | Freq: Every day | ORAL | 4 refills | 90 days | Status: CP
Start: 2022-08-02 — End: ?

## 2022-08-02 MED ORDER — METHOTREXATE SODIUM 2.5 MG TABLET
ORAL_TABLET | ORAL | 0 refills | 84 days | Status: CP
Start: 2022-08-02 — End: 2023-08-02

## 2022-08-02 MED ORDER — FOLIC ACID 1 MG TABLET
ORAL_TABLET | Freq: Every day | ORAL | 3 refills | 90 days | Status: CP
Start: 2022-08-02 — End: 2023-08-02

## 2022-08-07 DIAGNOSIS — N3000 Acute cystitis without hematuria: Principal | ICD-10-CM

## 2022-08-07 MED ORDER — NITROFURANTOIN MONOHYDRATE/MACROCRYSTALS 100 MG CAPSULE
ORAL_CAPSULE | Freq: Two times a day (BID) | ORAL | 0 refills | 5 days | Status: CP
Start: 2022-08-07 — End: 2022-08-12

## 2022-09-04 ENCOUNTER — Telehealth: Admit: 2022-09-04 | Discharge: 2022-09-05 | Payer: MEDICAID | Attending: Internal Medicine | Primary: Internal Medicine

## 2022-09-04 DIAGNOSIS — M0579 Rheumatoid arthritis with rheumatoid factor of multiple sites without organ or systems involvement: Principal | ICD-10-CM

## 2022-09-04 DIAGNOSIS — M1990 Unspecified osteoarthritis, unspecified site: Principal | ICD-10-CM

## 2022-09-04 DIAGNOSIS — M329 Systemic lupus erythematosus, unspecified: Principal | ICD-10-CM

## 2022-09-04 MED ORDER — SYRINGE WITH NEEDLE 1 ML 27 X 1/2"
SUBCUTANEOUS | 0 refills | 0 days | Status: CP
Start: 2022-09-04 — End: ?

## 2022-09-04 MED ORDER — METHOTREXATE SODIUM (CONTAINS PRESERVATIVES) 25 MG/ML INJECTION SOLUTION
1 refills | 0 days | Status: CP
Start: 2022-09-04 — End: ?

## 2022-09-04 MED ORDER — ORENCIA CLICKJECT 125 MG/ML SUBCUTANEOUS AUTO-INJECTOR
SUBCUTANEOUS | 6 refills | 28 days | Status: CP
Start: 2022-09-04 — End: ?
  Filled 2022-09-17: qty 4, 28d supply, fill #0

## 2022-09-04 MED ORDER — LEUCOVORIN CALCIUM 15 MG TABLET
ORAL_TABLET | ORAL | 3 refills | 84 days | Status: CP
Start: 2022-09-04 — End: 2023-09-04

## 2022-09-04 NOTE — Unmapped (Signed)
Assessment/Plan:      Mikayla Hernandez is a 31 y.o. female with history of previously diagnosed SLE presenting today for f/u. She established 02/23/20 with Dr. Marland Mcalpine. Patient was diagnosed at outside clinic in Nov 2019 and initial presenting symptoms were numbness/tingling in her b/l hands, b/l wrist pain and swelling, malar rash. Patient also developed knee/shoulder pain. ANA 1:1280, positive Sm, RNP, SSA, CCP, RF, and cardiolipin IgM with negative dsDNA and normal complements. She has previously tried hydroxychloroquine for periods of time in the past without much symptomatic relief.     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 She is experiencing several days of nausea after taking methotrexate.    - Switch methotrexate 15 mg from PO to subcutaneous q7 days. Risks and benefits were discussed and patient is agreeable.   - Replace daily folic acid with leucovorin 15 mg q7 days, the day after taking methotrexate.  - Will continue to update methotrexate monitoring labs at next visit.  - Continue HCQ 200 mg PO daily. Eye exam 01/2022 and due 2026.  - We discussed that if she cannot tolerate subcutaneous Methotrexate, or disease activity persists, we will escalate therapy to a biologic. 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    Return in about 8 weeks (around 10/30/2022).    The above plan was discussed at length with the patient who expresses clear understanding and is agreeable. We appreciate the opportunity to participate in the care of this patient.  As the attending physician, I personally spent 20 minutes (on video) 10 minutes in medical discussion with the patient via real time audio video visit participating in the key portions of the service, and non-face-to-face in the care of this patient, which includes all pre, intra, and post visit time on the date of service., which were specific to the patient and included reviewing the patient???s medical records, lab results, imaging results, and other pertinent records. I reviewed the resident's note and I agree with the resident's findings and plan.    The patient was not located and I was located within 250 yards of a hospital based location during the real-time audio and video visit. The patient was physically located in [place] or a state in which I am permitted to provide care. The patient and/or parent/guardian understood that s/he may incur co-pays and cost sharing, and agreed to the telemedicine visit. The visit was reasonable and appropriate under the circumstances given the patient's presentation at the time.    The patient and/or parent/guardian has been advised of the potential risks and limitations of this mode of treatment (including, but not limited to, the absence of in-person examination) and has agreed to be treated using telemedicine. The patient's/patient's family's questions regarding telemedicine have been answered.    If the visit was completed in an ambulatory setting, the patient and/or parent/guardian has also been advised to contact their provider???s office for worsening conditions, and seek emergency medical treatment and/or call 911 if the patient deems either necessary.    NOTE: Telemedicine enables health care providers at different locations to provide safe, effective, and convenient care through the use of technology. As with any health care service, there are risks associated with the use of telemedicine,, including lack of visualization,and there may be instances where the patient needs to be seen in person  The impression and recommendations were made on the basis of the  history obtained via phone or video connection.  All questions were answered, and the patient agreed to proceed      History of Present Illness:      The patient was seen in consultation at the request of Deboraha Sprang, NP for the evaluation of SLE.    Primary Care Provider: [facility]     Chief Complaint: Follow up joint pain, SLE    Identification: Pt self identified using name and date of birth  Patient location: Home  The limitations of this telemedicine encounter were discussed with patient. Both the patient and myself agreed to this encounter despite these limitations. Benefits of this telemedicine encounter included allowing for continued care of patient and minimizing risk of exposure to COVID-19.     HPI:  Mikayla Hernandez is a 31 y.o. female with a previous diagnosis of lupus nephritis who is currently controlled on Plaquenil and methotrexate.    Interim history:    Since last visit with Brennan Bailey on 08/02/2022, she has continued to have daily pain in hands, wrists, neck, shoulders and knees. She is currently on methotrexate, which makes her nauseous and fatigued for 3-4 days after taking the medication. She has 3 children, and the side effects of methotrexate are adversely impacting her ability to provide care.    She is willing to try an injectable form of the methotrexate before switching to a different medication to see if she can tolerate it.     Review of Systems: Negative for:  Fevers, chills, or night sweats. Balance of 10 systems was reviewed and is negative except for that mentioned in the HPI.     Balance of 10 systems was reviewed and is negative except for that mentioned in the HPI.    Review of records: I have reviewed labs/images/clinic notes per the computerized medical record. TPMT normal 02/23/20.    Allergies:  Prednisone     Medications:   Current Outpatient Medications   Medication Sig Dispense Refill    abatacept (ORENCIA CLICKJECT) 125 mg/mL AtIn subcutaneous auto-injector Inject the contents of 1 auto-injector (125 mg total) under the skin every seven (7) days. 4 mL 6    acetaminophen (TYLENOL) 325 MG tablet Take 2 tablets (650 mg total) by mouth Every six (6) hours. 120 tablet 0    amLODIPine-benazepril (LOTREL) 5-10 mg per capsule Take 1 capsule by mouth daily. 30 capsule 5    empty container Misc USE AS DIRECTED 1 each 2    hydrOXYchloroQUINE (PLAQUENIL) 200 mg tablet Take 1 tablet (200 mg total) by mouth daily. Take one tablet daily 90 tablet 4    ibuprofen (MOTRIN) 600 MG tablet Take 1 tablet (600 mg total) by mouth Every six (6) hours. 60 tablet 0    leuCOVorin (WELLCOVORIN) 15 MG tablet Take 1 tablet (15 mg total) by mouth once a week. Take 12-24 hours after each methotrexate dose weekly 12 tablet 3    methotrexate sodium (METHOTREXATE, CONTAINS PRESERVATIVES,) 25 mg/mL injection solution Inject 15mg  (0.6 mL) into the skin once weekly for 2 weeks THEN increase to 20mg  (0.8 mL) into the skin once weekly 12 mL 1    multivitamin, prenatal, folic acid-iron, 27-1 mg Tab Take 1 tablet by mouth daily.      syringe 1 mL 27 x 1/2 Syrg Inject 1 each under the skin once a week. Use weekly for methotrexate injection 50 each 0     Current Facility-Administered Medications   Medication Dose Route  Frequency Provider Last Rate Last Admin    methylPREDNISolone acetate (DEPO-MEDROL) injection 80 mg  80 mg Intramuscular Once Rockwell Alexandria, FNP         Medical history:  Past Medical History:   Diagnosis Date    Abnormal Pap smear of cervix     Gestational hypertension, third trimester 03/12/2022    Lupus (CMS-HCC)     Lupus anticoagulant affecting pregnancy in third trimester, antepartum (CMS-HCC) 03/05/2022    Preterm delivery     Pyelonephritis affecting pregnancy 03/05/2022    Rheumatoid arteritis (CMS-HCC)     SLE (systemic lupus erythematosus related syndrome) (CMS-HCC)     UTI (urinary tract infection) 05/27/2020    Varicella      Surgical History:   Past Surgical History:   Procedure Laterality Date    DILATION AND CURETTAGE OF UTERUS      PR CESAREAN DELIVERY ONLY N/A 07/31/2020    Procedure: CESAREAN DELIVERY ONLY;  Surgeon: Judeth Cornfield, MD;  Location: L&D C-SECTION OR SUITES [facility];  Service: Maternal-Fetal Medicine    WISDOM TOOTH EXTRACTION        Social history:  Social History     Socioeconomic History    Marital status: Single     Spouse name: None    Number of children: None    Years of education: None    Highest education level: None   Tobacco Use    Smoking status: Former     Packs/day: 0.25     Years: 8.00     Additional pack years: 0.00     Total pack years: 2.00     Types: Cigarettes     Quit date: 01/26/2020     Years since quitting: 2.6    Smokeless tobacco: Never   Substance and Sexual Activity    Alcohol use: Not Currently    Drug use: Not Currently     Types: Marijuana    Sexual activity: Yes     Partners: Male     Birth control/protection: None     Objective      Physical Exam:   Constitutional:   Patient does not appear to be in any acute distress   Eyes:   PERRLA, EOMI, and sclera aniteric. No scleral injection, no discharge, no allergic shiners.   Psychiatry:   Alert and oriented to person, place, and time. Appropriate affect. Mood and affect appropriate and congruent.   Neurological:  Alert and oriented to person, place and time.          Test results  No visits with results within 4 Week(s) from this visit.   Latest known visit with results is:   Office Visit on 08/02/2022   Component Date Value    ALT 08/02/2022 30     AST 08/02/2022 34     dsDNA Ab 08/02/2022 Negative     C3 Complement 08/02/2022 140     C4 Complement 08/02/2022 37.2 (H)     Color, UA 08/02/2022 Yellow     Clarity, UA 08/02/2022 Clear     Specific Gravity, UA 08/02/2022 1.015     pH, UA 08/02/2022 6.5     Leukocyte Esterase, UA 08/02/2022 Small (A)     Nitrite, UA 08/02/2022 Positive (A)     Protein, UA 08/02/2022 Negative     Glucose, UA 08/02/2022 Negative     Ketones, UA 08/02/2022 Negative     Urobilinogen, UA 08/02/2022 0.2 mg/dL     Bilirubin, UA 16/09/9603 Negative  Blood, UA 08/02/2022 Negative     RBC, UA 08/02/2022 <1     WBC, UA 08/02/2022 12 (H)     Squam Epithel, UA 08/02/2022 7 (H)     Bacteria, UA 08/02/2022 Many (A)     Amorphous Crystal, UA 08/02/2022 Occasional     Creatinine 08/02/2022 0.56 (L)     eGFR CKD-EPI (2021) Fema* 08/02/2022 >90     Creat U 08/02/2022 90.2     Protein, Ur 08/02/2022 16.5     Protein/Creatinine Ratio* 08/02/2022 0.183     WBC 08/02/2022 5.0     RBC 08/02/2022 4.02     HGB 08/02/2022 11.7     HCT 08/02/2022 35.4     MCV 08/02/2022 88.1     MCH 08/02/2022 29.0     MCHC 08/02/2022 33.0     RDW 08/02/2022 13.3     MPV 08/02/2022 7.8     Platelet 08/02/2022 322     Neutrophils % 08/02/2022 58.0     Lymphocytes % 08/02/2022 32.8     Monocytes % 08/02/2022 7.0     Eosinophils % 08/02/2022 1.3     Basophils % 08/02/2022 0.9     Absolute Neutrophils 08/02/2022 2.9     Absolute Lymphocytes 08/02/2022 1.6     Absolute Monocytes 08/02/2022 0.4     Absolute Eosinophils 08/02/2022 0.1     Absolute Basophils 08/02/2022 0.0     Quantiferon TB Gold Plus* 08/02/2022 Negative     Quantiferon TB NIL value 08/02/2022 0.07     Quantiferon Mitogen Minu* 08/02/2022 9.93     Quantiferon Antigen 1 mi* 08/02/2022 -0.01     Quantiferon Antigen 2 mi* 08/02/2022 -0.01     TB NIL VALUE 08/02/2022 0.07     TB AG1 VALUE 08/02/2022 0.06     TB AG2 VALUE 08/02/2022 0.06     TB Mitogen 08/02/2022 10.00     Urine Culture, Comprehen* 08/02/2022 >100,000 CFU/mL Escherichia coli (A)       Scribe's Attestation: Bennie Hind, MD obtained and performed the history, physical exam and medical decision making elements that were entered into the chart. Signed by Leim Fabry, Scribe, on 09/21/22 at 12:17 PM.     I have reviewed the documentation provided by the scribe and confirm that it accurately reflects the service I personally performed and the decisions made by me.

## 2022-09-05 DIAGNOSIS — M329 Systemic lupus erythematosus, unspecified: Principal | ICD-10-CM

## 2022-09-05 DIAGNOSIS — M1990 Unspecified osteoarthritis, unspecified site: Principal | ICD-10-CM

## 2022-09-05 DIAGNOSIS — M0579 Rheumatoid arthritis with rheumatoid factor of multiple sites without organ or systems involvement: Principal | ICD-10-CM

## 2022-09-13 NOTE — Unmapped (Signed)
[facility] Specialty Medication Onboarding    Specialty Medication: ORENCIA CLICKJECT 125 mg/mL Atin subcutaneous auto-injector (abatacept)  Prior Authorization: Approved   Financial Assistance: No - copay  <$25  Final Copay/Day Supply: $4 / 28    Insurance Restrictions: None     Notes to Pharmacist: n/a    The triage team has completed the benefits investigation and has determined that the patient is able to fill this medication at [facility]. Please contact the patient to complete the onboarding or follow up with the prescribing physician as needed.

## 2022-09-14 MED ORDER — EMPTY CONTAINER
2 refills | 0 days
Start: 2022-09-14 — End: ?

## 2022-09-14 NOTE — Unmapped (Signed)
[facility]   Patient Onboarding/Medication Counseling    Mikayla Hernandez is a 31 y.o. female with SLE/RA who I am counseling today on initiation of therapy.  I am speaking to the patient.    Was a Nurse, learning disability used for this call? No    Verified patient's date of birth / HIPAA.    Specialty medication(s) to be sent: Inflammatory Disorders: Orencia      Non-specialty medications/supplies to be sent: sharps container      Medications not needed at this time: n/a         Orencia (abatacept)    Medication & Administration     Dosage: Rheumatoid arthritis: Inject 125mg  under the skin every 7 days    Lab tests required prior to treatment initiation:  Tuberculosis: Tuberculosis screening resulted in a non-reactive Quantiferon TB Gold assay.  Hepatitis B: Hepatitis B serology studies are complete and non-reactive. 10/09/21    Administration:       Systems developer all supplies needed for injection on a clean, flat working surface: medication pen removed from packaging, alcohol swab, sharps container, etc.  Look at the medication label - look for correct medication, correct dose, and check the expiration date  Look at the medication - the liquid visible in the window on the side of the pen device should appear clear and colorless to pale yellow  Lay the auto-injector pen on a flat surface and allow it to warm up to room temperature for at least 30 minutes  Select injection site - you can use the front of your thigh or your belly (but not the area 2 inches around your belly button); if someone else is giving you the injection you can also use your upper arm in the skin covering your triceps muscle  Prepare injection site - wash your hands and clean the skin at the injection site with an alcohol swab and let it air dry, do not touch the injection site again before the injection  Remove the orange safety cap straight off - do not remove until immediately prior to injection and do not touch the clear needle cover  Gently squeeze the area of cleaned skin and hold it firmly to create a firm surface at the selected injection site  Put the clear needle cover against your skin at the injection site at a 90 degree angle, hold the pen such that you can see the clear medication window  Press down and hold the pen firmly against your skin, press the blue activator button to initiate the injection, there will be a click when the injection starts  Continue to hold the pen firmly against your skin for about 15 seconds - the window will start to turn solid blue  To verify the injection is complete after 15 seconds, look and ensure the window is solid blue and then pull the pen away from your skin  Dispose of the used auto-injector pen immediately in your sharps disposal container the needle will be covered automatically  If you see any blood at the injection site, press a cotton ball or gauze on the site and maintain pressure until the bleeding stops, do not rub the injection site      Adherence/Missed dose instructions:  If your injection is given more than 2 days after your scheduled injection date - consult your pharmacist or prescriber for additional instructions on how to adjust your dosing schedule.      Goals of Therapy  Rheumatoid arthritis  Achieve symptom remission  Slow disease progression  Protection of remaining articular structures  Maintenance of function  Maintenance of effective psychosocial functioning      Side Effects & Monitoring Parameters     Injection site reaction (redness, irritation, inflammation localized to the site of administration)  Signs of a common cold - minor sore throat, runny or stuffy nose, etc.  Upset stomach  Headache    The following side effects should be reported to the provider:  Signs of a hypersensitivity reaction - rash; hives; itching; red, swollen, blistered, or peeling skin; wheezing; tightness in the chest or throat; difficulty breathing, swallowing, or talking; swelling of the mouth, face, lips, tongue, or throat; etc.  Reduced immune function - report signs of infection such as fever; chills; body aches; very bad sore throat; ear or sinus pain; cough; more sputum or change in color of sputum; pain with passing urine; wound that will not heal, etc.  Also at a slightly higher risk of some malignancies (mainly skin and blood cancers) due to this reduced immune function.  In the case of signs of infection - the patient should hold the next dose of Orencia?? and call your primary care provider to ensure adequate medical care.  Treatment may be resumed when infection is treated and patient is asymptomatic.  Changes in skin - a new growth or lump that forms; changes in shape, size, or color of a previous mole or marking  Signs of high or low blood pressure - very bad headache; dizziness; passing out; changes in eyesight      Contraindications, Warnings, & Precautions     Have your bloodwork checked as you have been told by your prescriber  Talk with your doctor if you are pregnant, planning to become pregnant, or breastfeeding  If concomitant COPD there may be an elevated risk of breathing problems  Discuss the possible need for holding your dose(s) of Orencia?? when a planned procedure is scheduled with the prescriber as it may delay healing/recovery timeline       Drug/Food Interactions     Medication list reviewed in Epic. The patient was instructed to inform the care team before taking any new medications or supplements. No drug interactions identified.   Talk with you prescriber or pharmacist before receiving any live vaccinations while taking this medication and after you stop taking it    Storage, Handling Precautions, & Disposal     Store this medication in the refrigerator.  Do not freeze  If needed, you may store at room temperature for up to 8 hours  Store in original packaging, protected from light  Do not shake  Dispose of used syringes/pens in a sharps disposal container          Current Medications (including OTC/herbals), Comorbidities and Allergies     Current Outpatient Medications   Medication Sig Dispense Refill    abatacept (ORENCIA CLICKJECT) 125 mg/mL AtIn subcutaneous auto-injector Inject the contents of 1 auto-injector (125 mg total) under the skin every seven (7) days. 4 mL 6    acetaminophen (TYLENOL) 325 MG tablet Take 2 tablets (650 mg total) by mouth Every six (6) hours. 120 tablet 0    amLODIPine-benazepril (LOTREL) 5-10 mg per capsule Take 1 capsule by mouth daily. 30 capsule 5    hydrOXYchloroQUINE (PLAQUENIL) 200 mg tablet Take 1 tablet (200 mg total) by mouth daily. Take one tablet daily 90 tablet 4    ibuprofen (MOTRIN) 600 MG tablet Take  1 tablet (600 mg total) by mouth Every six (6) hours. 60 tablet 0    leuCOVorin (WELLCOVORIN) 15 MG tablet Take 1 tablet (15 mg total) by mouth once a week. Take 12-24 hours after each methotrexate dose weekly 12 tablet 3    methotrexate sodium (METHOTREXATE, CONTAINS PRESERVATIVES,) 25 mg/mL injection solution Inject 15mg  (0.6 mL) into the skin once weekly for 2 weeks THEN increase to 20mg  (0.8 mL) into the skin once weekly 12 mL 1    multivitamin, prenatal, folic acid-iron, 27-1 mg Tab Take 1 tablet by mouth daily.      syringe 1 mL 27 x 1/2 Syrg Inject 1 each under the skin once a week. Use weekly for methotrexate injection 50 each 0     Current Facility-Administered Medications   Medication Dose Route Frequency Provider Last Rate Last Admin    methylPREDNISolone acetate (DEPO-MEDROL) injection 80 mg  80 mg Intramuscular Once Rockwell Alexandria, FNP           Allergies   Allergen Reactions    Prednisone Hives       Patient Active Problem List   Diagnosis    Supervision of other high risk pregnancies, third trimester    Systemic lupus erythematosus affecting pregnancy (CMS-HCC)    Previous cesarean delivery     History of neonatal death    Pyelonephritis affecting pregnancy    Gestational hypertension, third trimester       Reviewed and up to date in Epic.    Appropriateness of Therapy     Acute infections noted within Epic:  No active infections  Patient reported infection: None    Is medication and dose appropriate based on diagnosis and infection status? Yes    Prescription has been clinically reviewed: Yes      Baseline Quality of Life Assessment      How many days over the past month did your RA/SLE  keep you from your normal activities? For example, brushing your teeth or getting up in the morning. Patient declined to answer    Financial Information     Medication Assistance provided: Prior Authorization    Anticipated copay of $4/28 days reviewed with patient. Verified delivery address.    Delivery Information     Scheduled delivery date: 10/17    Expected start date: 10/17    Medication will be delivered via UPS to the prescription address in [facility].  This shipment will not require a signature.      Explained the services we provide at [facility] and that each month we would call to set up refills.  Stressed importance of returning phone calls so that we could ensure they receive their medications in time each month.  Informed patient that we should be setting up refills 7-10 days prior to when they will run out of medication.  A pharmacist will reach out to perform a clinical assessment periodically.  Informed patient that a welcome packet, containing information about our pharmacy and other support services, a Notice of Privacy Practices, and a drug information handout will be sent.      The patient or caregiver noted above participated in the development of this care plan and knows that they can request review of or adjustments to the care plan at any time.      Patient or caregiver verbalized understanding of the above information as well as how to contact the pharmacy at [phone] option 4 with any questions/concerns.  The pharmacy  is open Monday through Friday 8:30am-4:30pm. A pharmacist is available 24/7 via pager to answer any clinical questions they may have.    Patient Specific Needs     Does the patient have any physical, cognitive, or cultural barriers? No    Does the patient have adequate living arrangements? (i.e. the ability to store and take their medication appropriately) Yes    Did you identify any home environmental safety or security hazards? No    Patient prefers to have medications discussed with  Patient     Is the patient or caregiver able to read and understand education materials at a high school level or above? Yes    Patient's primary language is  English     Is the patient high risk? No    SOCIAL DETERMINANTS OF HEALTH     At the [facility], we have learned that life circumstances - like trouble affording food, housing, utilities, or transportation can affect the health of many of our patients.   That is why we wanted to ask: are you currently experiencing any life circumstances that are negatively impacting your health and/or quality of life? Patient declined to answer    Social Determinants of Health     Financial Resource Strain: Not on file   Internet Connectivity: Not on file   Food Insecurity: Not on file   Tobacco Use: Medium Risk (09/04/2022)    Patient History     Smoking Tobacco Use: Former     Smokeless Tobacco Use: Never     Passive Exposure: Not on file   Housing/Utilities: Not on file   Alcohol Use: Not on file   Transportation Needs: Not on file   Substance Use: Not on file   Health Literacy: Not on file   Physical Activity: Not on file   Interpersonal Safety: Not on file   Stress: Not on file   Intimate Partner Violence: Not on file   Depression: Not on file   Social Connections: Not on file       Would you be willing to receive help with any of the needs that you have identified today? Not applicable       [name], PharmD  [facility] Specialty Pharmacist

## 2022-09-17 MED FILL — EMPTY CONTAINER: 90 days supply | Qty: 1 | Fill #0

## 2022-09-19 NOTE — Unmapped (Signed)
[facility] RHEUMATOLOGY CLINIC - PHARMACIST NOTES    Called to check in with patient after switching from oral to subcutaneous methotrexate. No answer, LVM requesting call back.     Dub Amis was also approved but patient was previously advised to not start therapy yet.    Patient was to return to see Brennan Bailey ~8 weeks after recent visit with Dr. Ilsa Iha - appt not set up yet.     Chelsea Aus

## 2022-09-20 NOTE — Unmapped (Signed)
[facility] RHEUMATOLOGY CLINIC - PHARMACIST NOTES    Able to reach patient. She is on subcutaneous methotrexate 15mg  subcutaneous once weekly (completed 2 doses thus far).  Feeling some fatigue and having stomach cramps with methotrexate therapy. She received Orencia and started injection 09/18/22. She states a nurse from clinic called her and informed her to start Orencia.  Given that she is not tolerating methotrexate too well - will plan to not titrate methotrexate further.     Plan:   - Continue methotrexate at 15mg  subcutaneous once weekly - inform clinic if symptoms worsens  - Ok to continue Orencia injection   - Methotrexate monitoring labs @ [facility] in 1-2 weeks   - Appt with Brennan Bailey on 11/15/22    Chelsea Aus

## 2022-10-08 NOTE — Unmapped (Signed)
Mikayla Hernandez has tolerated her Orencia pretty well but has noticed some insomnia. She hasn't noticed any therapeutic benefit yet but understands it may take more time.     [facility] Clinical Assessment & Refill Coordination Note    Mikayla Hernandez, [place]: 1991-11-14  Phone: There are no phone numbers on file.    All above HIPAA information was verified with patient.     Was a Nurse, learning disability used for this call? No    Specialty Medication(s):   Inflammatory Disorders: Orencia     Current Outpatient Medications   Medication Sig Dispense Refill    abatacept (ORENCIA CLICKJECT) 125 mg/mL AtIn subcutaneous auto-injector Inject the contents of 1 auto-injector (125 mg total) under the skin every seven (7) days. 4 mL 6    acetaminophen (TYLENOL) 325 MG tablet Take 2 tablets (650 mg total) by mouth Every six (6) hours. 120 tablet 0    amLODIPine-benazepril (LOTREL) 5-10 mg per capsule Take 1 capsule by mouth daily. 30 capsule 5    empty container Misc USE AS DIRECTED 1 each 2    hydrOXYchloroQUINE (PLAQUENIL) 200 mg tablet Take 1 tablet (200 mg total) by mouth daily. Take one tablet daily 90 tablet 4    ibuprofen (MOTRIN) 600 MG tablet Take 1 tablet (600 mg total) by mouth Every six (6) hours. 60 tablet 0    leuCOVorin (WELLCOVORIN) 15 MG tablet Take 1 tablet (15 mg total) by mouth once a week. Take 12-24 hours after each methotrexate dose weekly 12 tablet 3    methotrexate sodium (METHOTREXATE, CONTAINS PRESERVATIVES,) 25 mg/mL injection solution Inject 15mg  (0.6 mL) into the skin once weekly for 2 weeks THEN increase to 20mg  (0.8 mL) into the skin once weekly 12 mL 1    multivitamin, prenatal, folic acid-iron, 27-1 mg Tab Take 1 tablet by mouth daily.      syringe 1 mL 27 x 1/2 Syrg Inject 1 each under the skin once a week. Use weekly for methotrexate injection 50 each 0     Current Facility-Administered Medications   Medication Dose Route Frequency Provider Last Rate Last Admin methylPREDNISolone acetate (DEPO-MEDROL) injection 80 mg  80 mg Intramuscular Once Rockwell Alexandria, FNP            Changes to medications: [name] reports no changes at this time.    Allergies   Allergen Reactions    Prednisone Hives       Changes to allergies: No    SPECIALTY MEDICATION ADHERENCE     Orencia - 1 left  Medication Adherence    Patient reported X missed doses in the last month: 0  Specialty Medication: Orencia                            Specialty medication(s) dose(s) confirmed: Regimen is correct and unchanged.     Are there any concerns with adherence? No    Adherence counseling provided? Not needed    CLINICAL MANAGEMENT AND INTERVENTION      Clinical Benefit Assessment:    Do you feel the medicine is effective or helping your condition? No    Clinical Benefit counseling provided? Reasonable expectations discussed: May take ~3-6 months    Adverse Effects Assessment:    Are you experiencing any side effects? Yes, patient reports experiencing insomnia. Side effect counseling provided: patient currently taking tylenol pm to help - I advised to report worsening or if this impacts her  ADLs. Insomnia isn't listed in PI, but she reports about 2 days after each dose she has trouble for a few days     Are you experiencing difficulty administering your medicine? No    Quality of Life Assessment:    Quality of Life    Rheumatology  Oncology  Dermatology  1. What impact has your specialty medication had on the symptoms of your skin condition (i.e. itchiness, soreness, stinging)?: None  2. What impact has your specialty medication had on your comfort level with your skin?: None  Cystic Fibrosis          How many days over the past month did your SLE  keep you from your normal activities? For example, brushing your teeth or getting up in the morning. Patient declined to answer    Have you discussed this with your provider? Not needed    Acute Infection Status:    Acute infections noted within Epic:  No active infections  Patient reported infection: None    Therapy Appropriateness:    Is therapy appropriate and patient progressing towards therapeutic goals? Yes, therapy is appropriate and should be continued    DISEASE/MEDICATION-SPECIFIC INFORMATION      For patients on injectable medications: Patient currently has 1 doses left.  Next injection is scheduled for 11/7.    Chronic Inflammatory Diseases: Have you experienced any flares in the last month? No  Has this been reported to your provider? No    PATIENT SPECIFIC NEEDS     Does the patient have any physical, cognitive, or cultural barriers? No    Is the patient high risk? No    Did the patient require a clinical intervention? No    Does the patient require physician intervention or other additional services (i.e., nutrition, smoking cessation, social work)? No    SOCIAL DETERMINANTS OF HEALTH     At the [facility], we have learned that life circumstances - like trouble affording food, housing, utilities, or transportation can affect the health of many of our patients.   That is why we wanted to ask: are you currently experiencing any life circumstances that are negatively impacting your health and/or quality of life? Patient declined to answer    Social Determinants of Health     Financial Resource Strain: Not on file   Internet Connectivity: Not on file   Food Insecurity: Not on file   Tobacco Use: Medium Risk (09/04/2022)    Patient History     Smoking Tobacco Use: Former     Smokeless Tobacco Use: Never     Passive Exposure: Not on file   Housing/Utilities: Not on file   Alcohol Use: Not on file   Transportation Needs: Not on file   Substance Use: Not on file   Health Literacy: Not on file   Physical Activity: Not on file   Interpersonal Safety: Not on file   Stress: Not on file   Intimate Partner Violence: Not on file   Depression: Not on file   Social Connections: Not on file       Would you be willing to receive help with any of the needs that you have identified today? Not applicable       SHIPPING     Specialty Medication(s) to be Shipped:   Inflammatory Disorders: Orencia    Other medication(s) to be shipped: No additional medications requested for fill at this time     Changes to insurance: No    Delivery Scheduled:  Yes, Expected medication delivery date: 11/9.     Medication will be delivered via UPS to the confirmed prescription address in [facility].    The patient will receive a drug information handout for each medication shipped and additional FDA Medication Guides as required.  Verified that patient has previously received a Conservation officer, historic buildings and a Surveyor, mining.    The patient or caregiver noted above participated in the development of this care plan and knows that they can request review of or adjustments to the care plan at any time.      All of the patient's questions and concerns have been addressed.    Lanney Gins   [facility] Specialty Pharmacist

## 2022-10-10 MED FILL — ORENCIA CLICKJECT 125 MG/ML SUBCUTANEOUS AUTO-INJECTOR: SUBCUTANEOUS | 28 days supply | Qty: 4 | Fill #1

## 2022-10-17 DIAGNOSIS — M0579 Rheumatoid arthritis with rheumatoid factor of multiple sites without organ or systems involvement: Principal | ICD-10-CM

## 2022-10-18 DIAGNOSIS — M329 Systemic lupus erythematosus, unspecified: Principal | ICD-10-CM

## 2022-10-19 NOTE — Unmapped (Signed)
[facility] Pharm[facility] - Enhanced Care Program  Reason for Call: Lab; Type: Reminder  Referral Request: Rheumatology - Sheliah Mends, CPP    ??Summary of Telephone Encounter  ??? I received a call back from pt. I reminded pt of the lab work that was ordered for her to complete at [facility]. Pt is staying in the [place] area and she will be able to go today or Monday. I let pt know to give [place] a call if they are not able to find her orders so we can fax them over while she is there. Pt thanked me for the reminder call.   Follow-Up:  ??? Continue to reach out to pt and remind to complete overdue lab work. Once lab work has been completed the referral will be closed.     Call Attempt #: 2  Time Spent on Referral: 10 minutes  Number of Days Spent on Referral: 2    Vertell Limber RN, BSN  Nursing [facility]   Pharmacy Department  [facility]  [address]   [place], [place] [address]  [phone]

## 2022-10-19 NOTE — Unmapped (Signed)
 Pharmacy - Enhanced Care Program  Reason for Call: Lab; Type: Reminder  Referral Request: Rheumatology - Sheliah Mends, CPP    Summary of Telephone Encounter  ??? Pt is overdue for methotrexate monitoring labs. Pt was sent a MY CHART message on 10/09/22. Referral was received to assist reminding pt to have lab work completed. Lab work was sent to [facility].  ??? I called pt at phone # [phone] and left a VM request for pt to please return my call. Contact number provided.   ??? Pt is scheduled with Dr. Harless Nakayama on 11/15/22 @ 1:30 pm.  Follow-Up:  ??? Continue to reach out to pt and remind to complete overdue lab work. Once lab work has been completed the referral will be closed.     Call Attempt #: 1  Time Spent on Referral: 5 minutes  Number of Days Spent on Referral: 1     Aldean Ast, [facility]   Pharmacy Department  [facility]  [address]   [place], [place] [address]  [phone]

## 2022-10-29 NOTE — Unmapped (Signed)
[facility] - Enhanced Care Program  Reason for Call: Lab; Type: Reminder  Referral Request: Rheumatology - Sheliah Mends, CPP    ??Summary of Telephone Encounter  ??? I called pt at phone # [phone] to see if she was able to get lab work done at [facility] last week. I left a VM request for pt to please return my call. Contact number provided.   ??? I called [facility] at phone # [phone] but there were no test results available.    Follow-Up:  ??? Continue to reach out to pt and remind to complete overdue lab work. Once lab work has been completed the referral will be closed.     Call Attempt #: 3  Time Spent on Referral: 10 minutes  Number of Days Spent on Referral: 3    Vertell Limber RN, [facility]   Pharmacy Department  [facility]  [address]   [place], [place] [address]  [phone]

## 2022-11-05 NOTE — Unmapped (Signed)
[facility] Pharm[facility] - Enhanced Care Program  Reason for Call: Lab; Type: Reminder  Referral Request: Rheumatology - Sheliah Mends, CPP    ??Summary of Telephone Encounter  ??? I called pt at phone # [phone] to see if she was able to get lab work done at [facility] last week. I left a VM request for pt to please return my call. Contact number provided.   Follow-Up:  ??? Continue to reach out to pt and remind to complete overdue lab work. Once lab work has been completed the referral will be closed.     Call Attempt #: 4  Time Spent on Referral: 15 minutes  Number of Days Spent on Referral: 4    Vertell Limber RN, [facility]   Pharmacy Department  [facility]  [address]   [place], [place] [address]  [phone]

## 2022-11-06 ENCOUNTER — Ambulatory Visit: Admit: 2022-11-06 | Payer: MEDICAID | Attending: Obstetrics & Gynecology | Primary: Obstetrics & Gynecology

## 2022-11-08 NOTE — Unmapped (Signed)
[facility] - Enhanced Care Program  Reason for Call: Lab; Type: Reminder  Referral Request: Rheumatology - Sheliah Mends, CPP    ??Summary of Telephone Encounter  ??? I placed another call to pt at phone # [phone] to remind pt to have lab work completed. I left a VM request for pt to please return my call. Contact number provided.   ??? Pt is scheduled with Hector Shade on 11/15/22 @ 1:30 pm.   Follow-Up:  ??? I sent Sheliah Mends, CPP a message to let her know I will be closing the referral since pt is scheduled at the office next wk and can complete lab work at that time.      Call Attempt #: 5  Time Spent on Referral: 20 minutes  Number of Days Spent on Referral: 5    Vertell Limber RN, [facility]   Pharmacy Department  [facility]  [address]   [place], [place] [address]  [phone]

## 2022-11-14 NOTE — Unmapped (Unsigned)
REASON FOR VISIT: F/U SLE    HISTORY: Mikayla Hernandez is a 31 y.o. female with history of previously diagnosed SLE presenting today for f/u. She established 02/23/20 with Dr. Marland Mcalpine. Patient was diagnosed at outside clinic in Nov 2019 and initial presenting symptoms were numbness/tingling in her b/l hands, b/l wrist pain and swelling, malar rash. Patient also developed knee/shoulder pain. ANA 1:1280, positive Sm, RNP, SSA, CCP, RF, and cardiolipin IgM with negative dsDNA and normal complements. She has previously tried hydroxychloroquine for periods of time in the past without much symptomatic relief.      Last Visit: 09/2022 via telemedicine with Dr. Marland Mcalpine    No show: 07/12/21, 11/15/21, 05/30/22  Same day cancellation: 09/05/21    Interim history:  Pt presents for f/u today.     HCQ?  Eye exam?    Joints?  Swelling?  AM stiffness?    MTX?  Orencia?    ***birthcontrol?        CURRENT MEDICATIONS:  Current Outpatient Medications   Medication Sig Dispense Refill    abatacept (ORENCIA CLICKJECT) 125 mg/mL AtIn subcutaneous auto-injector Inject the contents of 1 auto-injector (125 mg total) under the skin every seven (7) days. 4 mL 6    acetaminophen (TYLENOL) 325 MG tablet Take 2 tablets (650 mg total) by mouth Every six (6) hours. 120 tablet 0    amLODIPine-benazepril (LOTREL) 5-10 mg per capsule Take 1 capsule by mouth daily. 30 capsule 5    empty container Misc USE AS DIRECTED 1 each 2    hydrOXYchloroQUINE (PLAQUENIL) 200 mg tablet Take 1 tablet (200 mg total) by mouth daily. Take one tablet daily 90 tablet 4    ibuprofen (MOTRIN) 600 MG tablet Take 1 tablet (600 mg total) by mouth Every six (6) hours. 60 tablet 0    leuCOVorin (WELLCOVORIN) 15 MG tablet Take 1 tablet (15 mg total) by mouth once a week. Take 12-24 hours after each methotrexate dose weekly 12 tablet 3    methotrexate sodium (METHOTREXATE, CONTAINS PRESERVATIVES,) 25 mg/mL injection solution Inject 15mg  (0.6 mL) into the skin once weekly for 2 weeks THEN increase to 20mg  (0.8 mL) into the skin once weekly 12 mL 1    multivitamin, prenatal, folic acid-iron, 27-1 mg Tab Take 1 tablet by mouth daily.      syringe 1 mL 27 x 1/2 Syrg Inject 1 each under the skin once a week. Use weekly for methotrexate injection 50 each 0     Current Facility-Administered Medications   Medication Dose Route Frequency Provider Last Rate Last Admin    methylPREDNISolone acetate (DEPO-MEDROL) injection 80 mg  80 mg Intramuscular Once Rockwell Alexandria, FNP           Past Medical History:   Diagnosis Date    Abnormal Pap smear of cervix     Gestational hypertension, third trimester 03/12/2022    Lupus (CMS-HCC)     Lupus anticoagulant affecting pregnancy in third trimester, antepartum (CMS-HCC) 03/05/2022    Preterm delivery     Pyelonephritis affecting pregnancy 03/05/2022    Rheumatoid arteritis (CMS-HCC)     SLE (systemic lupus erythematosus related syndrome) (CMS-HCC)     UTI (urinary tract infection) 05/27/2020    Varicella         Record Review: Available records were reviewed, including pertinent office visits, labs, and imaging.      TPMT normal 02/23/20    REVIEW OF SYSTEMS: Ten system were reviewed and negative except as noted above.  PHYSICAL EXAM:***  VITAL SIGNS:   There were no vitals filed for this visit.      General:   Pleasant 31 y.o.female in no acute distress, WDWN   Eyes:   PERRL, conjunctiva and sclera not inflamed. Tears appear adequate.    ENT:   No oropharyngeal lesions. Mucous membranes moist.    Lymph:   No masses or cervical lymphadenopathy.    Cardiovascular:  Regular rate and rhythm. No murmur, rub, or gallop. No lower extremity edema.    Lungs:  Clear to auscultation.Normal respiratory effort.    Musculoskeletal:   General: Ambulates w/o assistance   Hands: B/L  3rd MCP and PIP swollen/tender. Able to make loose fist b/l.   Wrists:Dec ROM to R, mild swelling to b/l wrist, R>L, TTP  Elbows: Mild restriction in extension b/l.   Shoulders: FROM w/o pain   Knees: FROM w/o effusions. Tender b/l.   Ankles: No swelling or tenderness   Feet: No pain with MTP squeeze    Neurological:  CN 2-12 grossly intact. 5/5 strength on extremities.   Psych:  Appropriate affect and mood   Skin:  No rashes.          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 Discussed starting MTX since she has now given birth, not breastfeeding and has Nexplanon in place.    - Update monitoring labs: CBCw/diff, Crt, AST, ALT, dsDNA, C3/C4, UA, UPC  - Continue HCQ 200mg  PO daily. Eye exam 01/2022 and due 2026.  - 80mg  IM depomedrol today for current symptoms. Pt has tolerated oral medrol well in past w/o adverse s/e.    - Start MTX 15mg  PO weekly and FA daily.   -Discussed the potential complications of this therapy, including the following: liver toxicity, renal impairment, GI distress, bone marrow suppression, stomatitis, alopecia, and more rarely mtx pneumonitis. Discussed the risks for teratogenesis, and pt will need to use effective contraception. We will be writing a folic acid at 1 mg daily to help prevent some of these side effects. Pt verbalizes understanding of the risks, and wishes to proceed with therapy.   - Will repeat labs at next visit in October.   - Reviewed clinic no show policy again with pt. Informed her she has had three no shows so if she no shows/same day cancellation she will be discharged from clinic. Urged pt to contact prior to 24 hours of appointment if unable to make appointment. Pt verbalized understanding.       HCM:   Immunization History   Administered Date(s) Administered Comments    COVID-19 VAC,MRNA,TRIS(12Y UP)(PFIZER)(GRAY CAP) 03/28/2021 Historical - Not administered in Epic    COVID-19 VACC,MRNA,(PFIZER)(PF) 08/01/2020      12/13/2020 Historical - Not administered in Epic     03/28/2021     Influenza Vaccine Quad(IM)6 MO-Adult(PF) 03/12/2022     TdaP 03/12/2022    Deferred Date(s) Deferred Comments    TdaP 07/30/2020    - PCV13 Status: didn't discuss today  - PPSV 23 Status: didn't discuss today  - Bone health: not on prednisone   - Plaquenil eye exam: 01/2022    F/U as scheduled in March with Dr. Tomasa Rand    I personally spent *** minutes face-to-face and non-face-to-face in the care of this patient, which includes all pre, intra, and post visit time on the date of service.  All documented time was specific to the E/M visit and does not include any procedures  that may have been performed.

## 2022-11-15 ENCOUNTER — Ambulatory Visit: Admit: 2022-11-15 | Discharge: 2022-11-16 | Payer: MEDICAID | Attending: Family | Primary: Family

## 2022-11-15 DIAGNOSIS — M1990 Unspecified osteoarthritis, unspecified site: Principal | ICD-10-CM

## 2022-11-15 DIAGNOSIS — M329 Systemic lupus erythematosus, unspecified: Principal | ICD-10-CM

## 2022-11-15 DIAGNOSIS — Z23 Encounter for immunization: Principal | ICD-10-CM

## 2022-11-15 DIAGNOSIS — Z79899 Other long term (current) drug therapy: Principal | ICD-10-CM

## 2022-11-15 LAB — CBC W/ AUTO DIFF
BASOPHILS ABSOLUTE COUNT: 0.1 10*9/L (ref 0.0–0.1)
BASOPHILS RELATIVE PERCENT: 0.9 %
EOSINOPHILS ABSOLUTE COUNT: 0.1 10*9/L (ref 0.0–0.5)
EOSINOPHILS RELATIVE PERCENT: 1.5 %
HEMATOCRIT: 34.9 % (ref 34.0–44.0)
HEMOGLOBIN: 11.7 g/dL (ref 11.3–14.9)
LYMPHOCYTES ABSOLUTE COUNT: 1.8 10*9/L (ref 1.1–3.6)
LYMPHOCYTES RELATIVE PERCENT: 31.9 %
MEAN CORPUSCULAR HEMOGLOBIN CONC: 33.4 g/dL (ref 32.0–36.0)
MEAN CORPUSCULAR HEMOGLOBIN: 30 pg (ref 25.9–32.4)
MEAN CORPUSCULAR VOLUME: 89.7 fL (ref 77.6–95.7)
MEAN PLATELET VOLUME: 7.9 fL (ref 6.8–10.7)
MONOCYTES ABSOLUTE COUNT: 0.4 10*9/L (ref 0.3–0.8)
MONOCYTES RELATIVE PERCENT: 7 %
NEUTROPHILS ABSOLUTE COUNT: 3.4 10*9/L (ref 1.8–7.8)
NEUTROPHILS RELATIVE PERCENT: 58.7 %
NUCLEATED RED BLOOD CELLS: 0 /100{WBCs} (ref <=4)
PLATELET COUNT: 283 10*9/L (ref 150–450)
RED BLOOD CELL COUNT: 3.9 10*12/L — ABNORMAL LOW (ref 3.95–5.13)
RED CELL DISTRIBUTION WIDTH: 13.4 % (ref 12.2–15.2)
WBC ADJUSTED: 5.8 10*9/L (ref 3.6–11.2)

## 2022-11-15 LAB — URINALYSIS WITH MICROSCOPY WITH CULTURE REFLEX
BILIRUBIN UA: NEGATIVE
BLOOD UA: NEGATIVE
GLUCOSE UA: NEGATIVE
KETONES UA: NEGATIVE
NITRITE UA: POSITIVE — AB
PH UA: 6.5 (ref 5.0–9.0)
PROTEIN UA: NEGATIVE
RBC UA: 0 /HPF (ref 0–3)
SPECIFIC GRAVITY UA: 1.015 (ref 1.005–1.030)
SQUAMOUS EPITHELIAL: 5 /HPF (ref 0–5)
UROBILINOGEN UA: 0.2
WBC UA: >100 /HPF — ABNORMAL HIGH (ref 0–3)

## 2022-11-15 LAB — C3 COMPLEMENT: C3 COMPLEMENT: 113 mg/dL (ref 90–170)

## 2022-11-15 LAB — PROTEIN / CREATININE RATIO, URINE
CREATININE, URINE: 139.7 mg/dL
PROTEIN URINE: 18.9 mg/dL
PROTEIN/CREAT RATIO, URINE: 0.135

## 2022-11-15 LAB — CREATININE
CREATININE: 0.54 mg/dL — ABNORMAL LOW
EGFR CKD-EPI (2021) FEMALE: >90 mL/min/{1.73_m2} (ref >=60)

## 2022-11-15 LAB — C-REACTIVE PROTEIN: C-REACTIVE PROTEIN: <4 mg/L (ref <=10.0)

## 2022-11-15 LAB — SEDIMENTATION RATE: ERYTHROCYTE SEDIMENTATION RATE: 56 mm/h — ABNORMAL HIGH (ref 0–20)

## 2022-11-15 LAB — ALT: ALT (SGPT): 43 U/L (ref 10–49)

## 2022-11-15 LAB — C4 COMPLEMENT: C4 COMPLEMENT: 23.4 mg/dL (ref 12.0–36.0)

## 2022-11-15 LAB — AST: AST (SGOT): 51 U/L — ABNORMAL HIGH (ref <=34)

## 2022-11-15 MED ORDER — LEFLUNOMIDE 10 MG TABLET
ORAL_TABLET | Freq: Every day | ORAL | 1 refills | 30 days | Status: CP
Start: 2022-11-15 — End: 2023-11-15

## 2022-11-15 NOTE — Unmapped (Signed)
Leflunomide (arava) (Information from the American College of Rheumatology)    Leflunomide (Arava) is a drug approved to treat adult moderate to severe rheumatoid arthritis. It belongs to a class of medications called disease-modifying antirheumatic drugs (DMARDs). This class of medicines can decrease joint damage and disability caused by rheumatoid arthritis. Leflunomide is often used to treat rheumatoid arthritis alone or in combination with other DMARDs excluding a methotrexate.    Leflunomide blocks the formation of DNA, which is important for replicating cells, such as those in the immune system. It suppresses the immune system to reduce inflammation that causes pain and swelling in rheumatoid arthritis.    How to Take It  Leflunomide usually is given as a 20mg tablet once a day. Sometimes, patients are given only 10mg, especially if they experienced side effects with the higher dose. Doctors will often prescribe a ???loading dose??? to be taken when the medicine is first prescribed. A loading dose is sometimes given for medicines that take a long time to build up in the body. The loading dose of leflunomide is usually 100mg (or five 20mg tablets) once weekly for 3 weeks or 100mg a day for the first 3 days after starting leflunomide. The loading dose increases a patient???s chances of experiencing diarrhea, but this side effect usually improves after completing the loading dose. Leflunomide should be taken with food.    It may take several weeks after starting leflunomide to experience an improvement in joint pain or swelling. Complete benefits may not be experienced until 6 -12 weeks after starting the medication.    It is important that you have regular blood tests, including those for liver function, while taking this medication. You should not take leflunomide if you have a pre-existing liver disease, such as hepatitis or cirrhosis. Because alcohol may increase the risk of liver damage from leflunomide, alcohol should be avoided. Any alcohol use should be discussed with your doctor.    Side Effects  The most common side effect of leflunomide is diarrhea, which occurs in approximately 20% of patients. This symptom frequently improves with time or by taking a medication to prevent diarrhea. If diarrhea persists, the dose of leflunomide may need to be reduced.    Less common side effects include nausea, stomach pain, indigestion, rash, and hair loss. In fewer than 10% of patients, leflunomide can cause abnormal liver function tests or decreased blood cell or platelet counts. Rarely, this drug may cause lung problems, such as cough, shortness of breath or lung injury.    Tell Your Doctor  Leflunomide can cause serious birth defects. If you are pregnant or are considering having a child, you should discuss this issue with your doctor before beginning the medication. For this reason, it usually is not prescribed to young women. Breastfeeding while taking leflunomide is not recommended. Use of an effective form of birth control is critical throughout the course of this treatment and for up to two years after it is stopped. This is important, because leflunomide lasts in the body a long time, even after stopping the medication, and could still cause birth defects during this time. Men taking leflunomide who wish to have a child also should talk with their physicians about how to discontinue the medication. Cholestyramine is a medication you can take to help remove leflunomide from your system.    Be sure to tell your doctor about all of the medications you are taking, including over-the-counter drugs and natural remedies, as these may reduce the effectiveness of   leflunomide. The following medications are among those that may interfere with leflunomide: cholestyramine (Questran), tolbutamide (Orinase), and rifampin (Rifadin or Rimactane). Any medications that can affect the liver should be used with caution with leflunomide.    Live vaccinations should be avoided while taking this medication. Be sure to discuss any vaccines with your doctor before receiving them. It may be important to receive certain vaccines before starting this medication, such as the Pneumovax (pneumonia vaccine), hepatitis B, tetanus booster, or the Zostavax (shingles vaccine) for some patients.    Because this medication can lower your immunity, it is important you discuss this with any physician treating you for an infection, as this may lead to a different evaluation or treatment. Also, be sure to notify your doctor before any surgeries while taking this medication, as Leflunomide can increase the risk of post-operative infections and/or slow the healing of your wounds.    The risk of liver injury may be increased if leflunomide is combined with other medications. Be sure to discuss this with other physicians when new medications are prescribed.    Updated March 2017 by Ziv Paz, MD and reviewed by the American College of Rheumatology Committee on Communications and Marketing. This information is provided for general education only.    Individuals should consult a qualified health care provider for professional medical advice, diagnosis and treatment of a medical or health condition.    ?? 2017 American College of Rheumatology

## 2022-11-16 DIAGNOSIS — N3000 Acute cystitis without hematuria: Principal | ICD-10-CM

## 2022-11-16 MED ORDER — NITROFURANTOIN MONOHYDRATE/MACROCRYSTALS 100 MG CAPSULE
ORAL_CAPSULE | Freq: Two times a day (BID) | ORAL | 0 refills | 5 days | Status: CP
Start: 2022-11-16 — End: 2022-11-21

## 2022-11-28 LAB — ANTI-DNA ANTIBODY, DOUBLE-STRANDED: DSDNA ANTIBODY: NEGATIVE

## 2022-11-29 IMAGING — US US OB COMP LESS 14 WK
1 series · 15 of 28 positions shown · non-contrast
Comparison: None.

CLINICAL DATA: Vaginal bleeding. Quantitative beta HCG on
08/23/2021 was [DATE]. LMP was 07/12/2021. Gestational age by LMP is
7 weeks 4 days.

EXAM:
OBSTETRIC <14 WK ULTRASOUND
TECHNIQUE: Transabdominal ultrasound was performed for evaluation of the
gestation as well as the maternal uterus and adnexal regions.

[Series 1: us ob comp less 14 wks mc & wl · 15 of 39 slices shown]
[im 1/39]
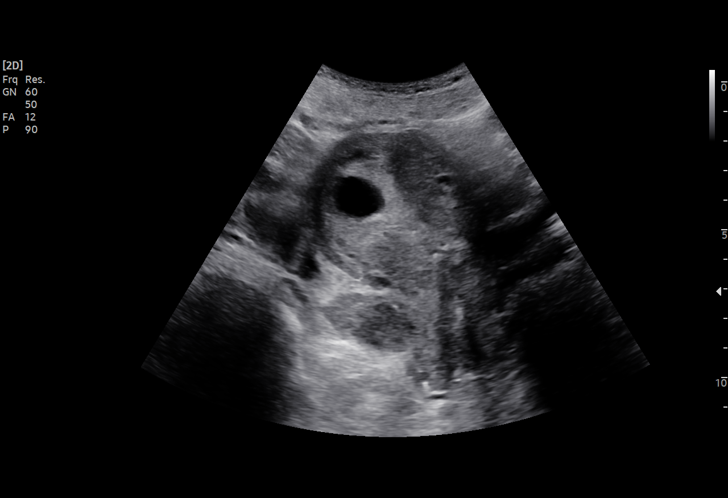
[im 3/39]
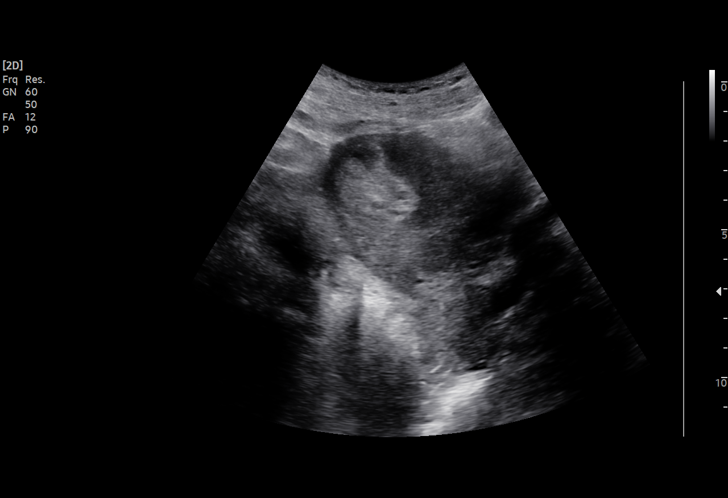
[im 6/39]
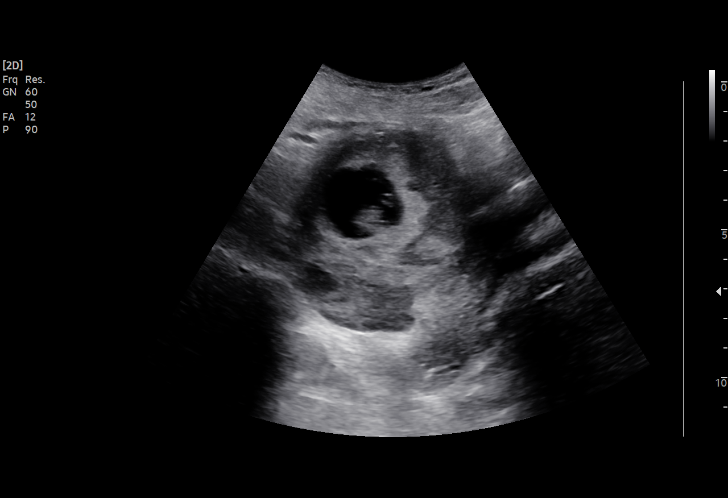
[im 9/39]
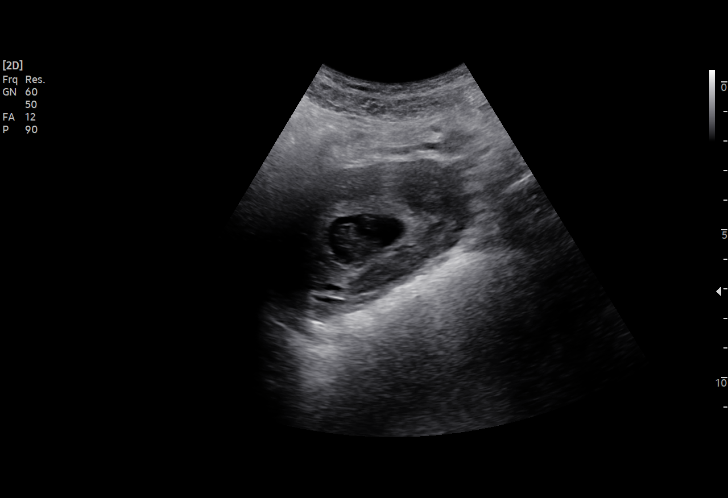
[im 12/39]
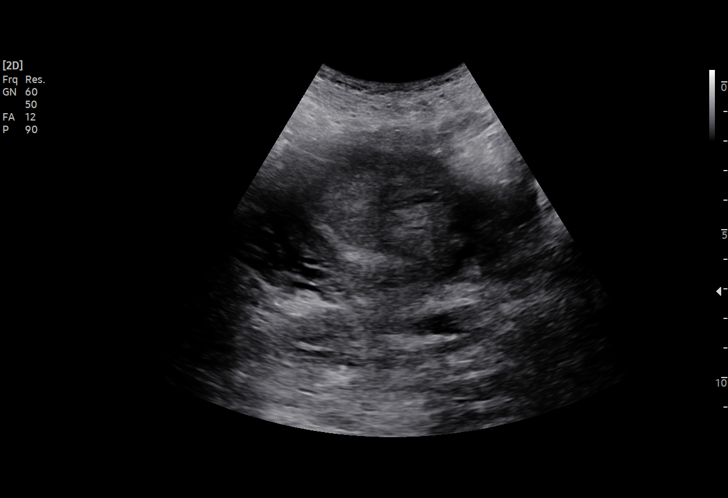
[im 15/39]
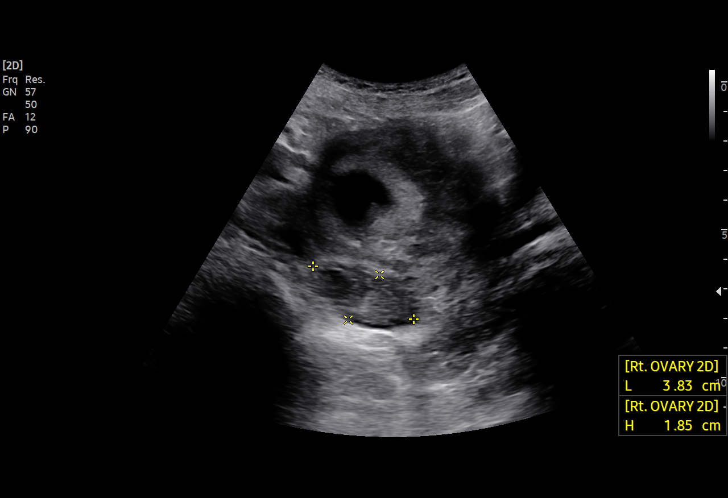
[im 17/39]
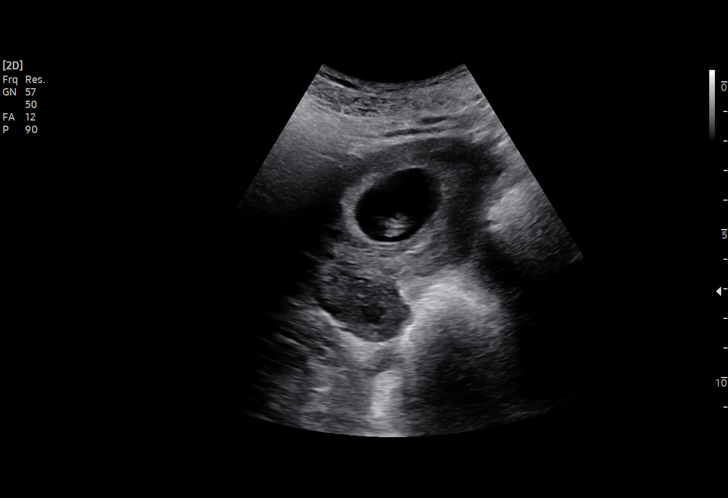
[im 20/39]
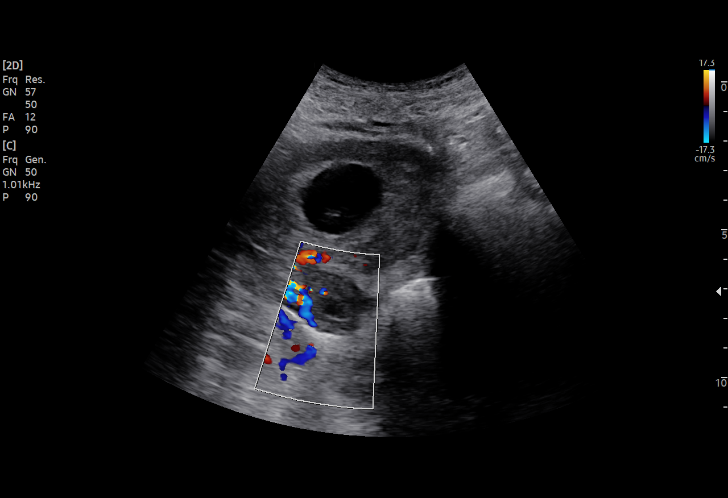
[im 22/39]
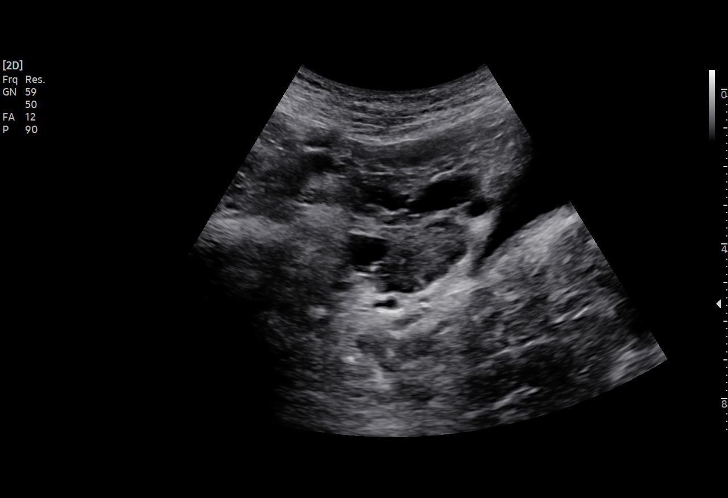
[im 24/39]
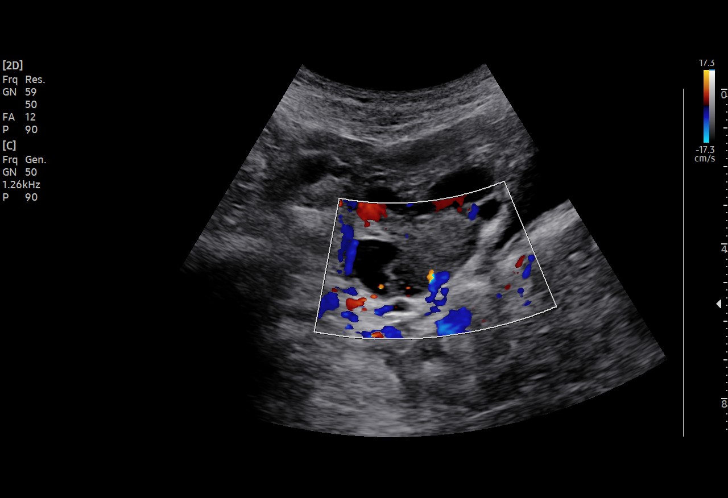
[im 27/39]
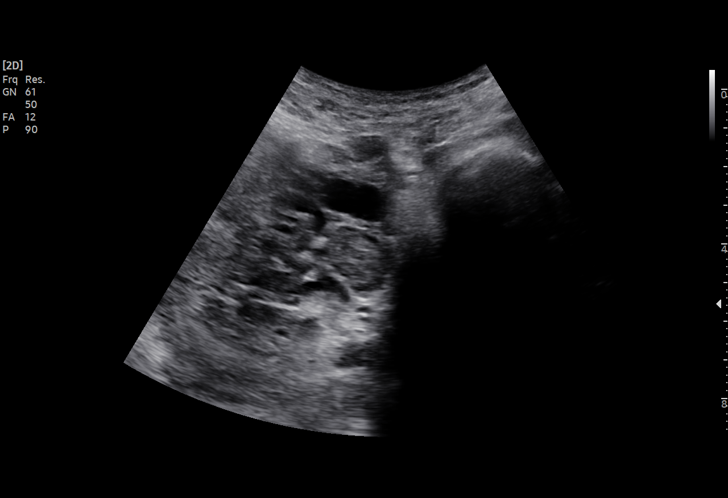
[im 30/39]
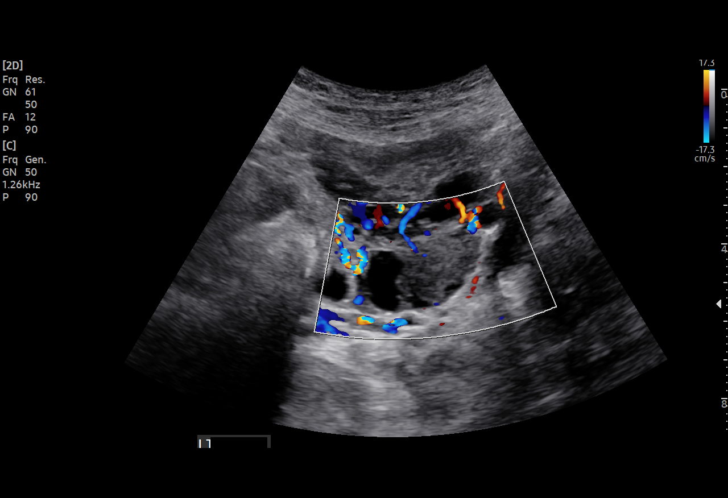
[im 33/39]
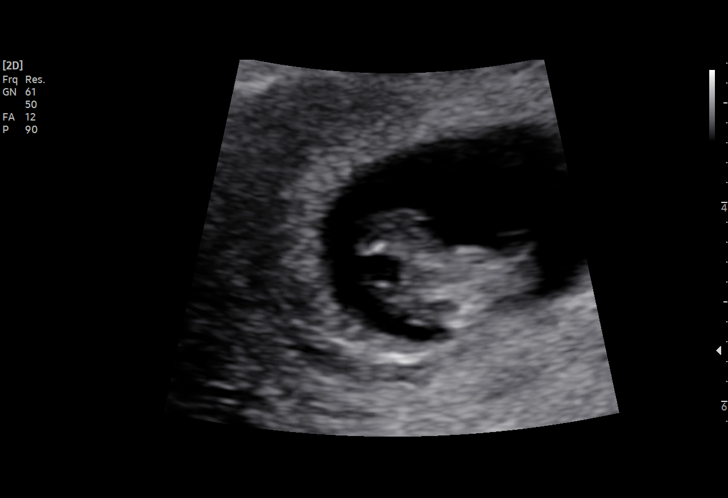
[im 36/39]
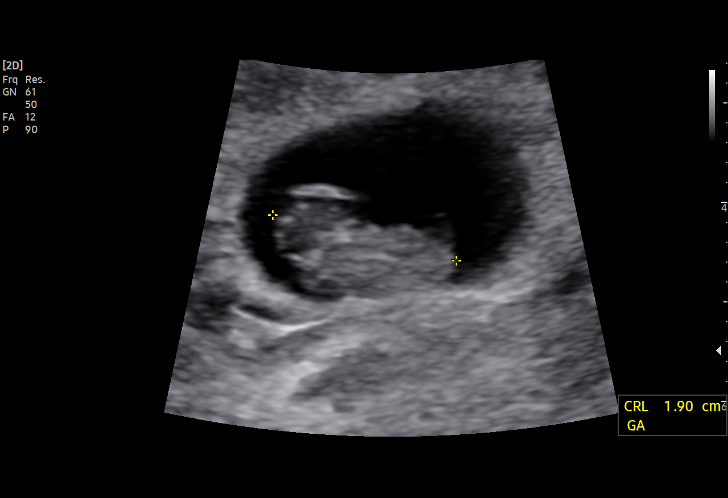
[im 39/39]
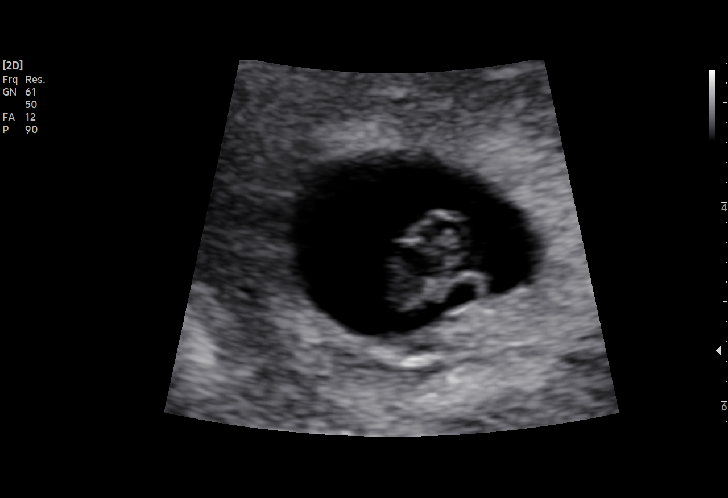

[15 of 28 positions shown; findings below may reference images not displayed]

FINDINGS: Intrauterine gestational sac: Single

Yolk sac:  Visualized.

Embryo:  Visualized.

Cardiac Activity: Visualized.

Heart Rate: 162 bpm

CRL:   19.2 mm   8 w 3 d                  US EDC: 04/12/2022

Subchorionic hemorrhage:  Small subchorionic hemorrhage.

Maternal uterus/adnexae: RIGHT corpus luteum cyst. LEFT ovary is
normal. No free pelvic fluid.
IMPRESSION: 1. Single living intrauterine embryo.
2. Size by ultrasound correlates within 6 days of assigned EDC of
04/18/2022.
3. No subchorionic hemorrhage or free fluid.

## 2023-02-12 NOTE — Unmapped (Unsigned)
Rheumatology Clinic Follow-up Note     Assessment/Plan:    Mikayla Hernandez is a 32 y.o.  female with a PMHx of previously diagnosed SLE presenting today for f/u. She established 02/23/20 with Dr. Marland Mcalpine. Patient was diagnosed at outside clinic in Nov 2019 and initial presenting symptoms were numbness/tingling in her b/l hands, b/l wrist pain and swelling, malar rash. Patient also developed knee/shoulder pain. ANA 1:1280, positive Sm, RNP, SSA, CCP, RF, and cardiolipin IgM with negative dsDNA and normal complements. She has previously tried hydroxychloroquine for periods of time in the past without much symptomatic relief.      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 Unable to tolerate MTX due to nausea and weight loss. Discussed that full benefit from Orencia can take 3-4 months so would expect further improvement in coming months.     - Update monitoring labs: CBCw/diff, Crt, AST, ALT, dsDNA, C3/C4, UA, UPC, CRP , sed rate  - Continue HCQ 200mg  PO daily. Eye exam 01/2022 and due 2026.   - Start LEF 10mg  PO daily.   - Reviewed the risks and benefits of Leflunomide therapy with the patient.  Reviewed the risk of liver toxicity, diarrhea, marrow suppression, and teratogenesis. We have discussed the importance of limiting alcohol consumption, using effective birth control, and returning regularly for laboratory evaluation.   - Will repeat labs 4 weeks after starting LEF. If tolerating well will increase to LEF 20mg  PO daily.     There are no diagnoses linked to this encounter.     No follow-ups on file.    Fleeta Emmer, MD  Assistant Professor of Medicine  Department of Medicine/Division of Rheumatology  [facility]    Primary Care Provider: [facility], [facility]    HPI:  Mikayla Hernandez is a 32 y.o.  female with a PMHx of *** who presents today for follow-up of ***    Review of Systems:  Positive findings noted above, otherwise a 14 point review of systems was reviewed and negative    Past Medical, Surgical, Family and Social History reviewed and updated per EMR     Allergies:  Prednisone    Medications:     Current Outpatient Medications:     abatacept (ORENCIA CLICKJECT) 125 mg/mL AtIn subcutaneous auto-injector, Inject the contents of 1 auto-injector (125 mg total) under the skin every seven (7) days., Disp: 4 mL, Rfl: 6    acetaminophen (TYLENOL) 325 MG tablet, Take 2 tablets (650 mg total) by mouth Every six (6) hours., Disp: 120 tablet, Rfl: 0    amLODIPine-benazepril (LOTREL) 5-10 mg per capsule, Take 1 capsule by mouth daily., Disp: 30 capsule, Rfl: 5    empty container Misc, USE AS DIRECTED, Disp: 1 each, Rfl: 2    famotidine (PEPCID) 20 MG tablet, , Disp: , Rfl:     hydrOXYchloroQUINE (PLAQUENIL) 200 mg tablet, Take 1 tablet (200 mg total) by mouth daily. Take one tablet daily, Disp: 90 tablet, Rfl: 4    ibuprofen (MOTRIN) 600 MG tablet, Take 1 tablet (600 mg total) by mouth Every six (6) hours. (Patient taking differently: Take 1 tablet (600 mg total) by mouth every six (6) hours. Is taking 800mg ), Disp: 60 tablet, Rfl: 0    leflunomide (ARAVA) 10 MG tablet, Take 1 tablet (10 mg total) by mouth daily., Disp: 30 tablet, Rfl: 1  leuCOVorin (WELLCOVORIN) 15 MG tablet, Take 1 tablet (15 mg total) by mouth once a week. Take 12-24 hours after each methotrexate dose weekly, Disp: 12 tablet, Rfl: 3    multivitamin, prenatal, folic acid-iron, 27-1 mg Tab, Take 1 tablet by mouth daily., Disp: , Rfl:     Current Facility-Administered Medications:     methylPREDNISolone acetate (DEPO-MEDROL) injection 80 mg, 80 mg, Intramuscular, Once, [place], Jonnie Finner, FNP      Objective   There were no vitals filed for this visit.    Physical Exam  General: well appearing, no acute distress  Eyes: EOMI, normal conjunctivae   ENT: MMM.  Oropharynx without any erythema or exudate.  No oral or nasal ulcers.  Neck: supple. No cervical lymphadenopathy  Cardiovascular: Regular rate and rhythm. Nl S1 and S2.  Pulmonary: Nl WOB on RA, CTAB.  Skin: ***no rash, lesions, breakdown. No purpura or petechiae. No digital ulcers.   Extremities: Warm and well perfused, no cyanosis, clubbing or edema  Musculoskeletal: ***No synovitis or tenderness to palpation in the hands, wrists, elbows, shoulders, knees, ankles, or feet. Full ROM throughout.   Neurologic: Cranial nerves grossly intact, strength 5/5 throughout.  Normal sensation  Psychiatric: Normal mood and affect.    I personally spent *** minutes face-to-face and non-face-to-face in the care of this patient, which includes all pre, intra, and post visit time on the date of service.  All documented time was specific to the E/M visit and does not include any procedures that may have been performed.

## 2023-02-26 DIAGNOSIS — M1990 Unspecified osteoarthritis, unspecified site: Principal | ICD-10-CM

## 2023-02-26 DIAGNOSIS — M329 Systemic lupus erythematosus, unspecified: Principal | ICD-10-CM

## 2023-02-26 MED ORDER — LEFLUNOMIDE 10 MG TABLET
ORAL_TABLET | Freq: Every day | ORAL | 1 refills | 30 days
Start: 2023-02-26 — End: 2024-02-26

## 2023-02-27 MED ORDER — LEFLUNOMIDE 10 MG TABLET
ORAL_TABLET | Freq: Every day | ORAL | 3 refills | 90 days
Start: 2023-02-27 — End: 2024-02-27

## 2023-02-27 NOTE — Unmapped (Signed)
Leflunomide refill  Last Visit Date: 11/15/2022  Next Visit Date: 04/11/2023    Lab Results   Component Value Date    ALT 43 11/15/2022    AST 51 (H) 11/15/2022    ALBUMIN 3.0 (L) 05/06/2020    CREATININE 0.54 (L) 11/15/2022     Lab Results   Component Value Date    WBC 5.8 11/15/2022    HGB 11.7 11/15/2022    HCT 34.9 11/15/2022    PLT 283 11/15/2022     Lab Results   Component Value Date    NEUTROPCT 58.7 11/15/2022    LYMPHOPCT 31.9 11/15/2022    MONOPCT 7.0 11/15/2022    EOSPCT 1.5 11/15/2022    BASOPCT 0.9 11/15/2022

## 2023-03-18 MED ORDER — LEFLUNOMIDE 10 MG TABLET
ORAL_TABLET | Freq: Every day | ORAL | 3 refills | 90 days
Start: 2023-03-18 — End: 2024-03-17

## 2023-03-18 NOTE — Unmapped (Signed)
[facility] has made a fourth and final attempt to reach this patient to refill the following medication: Orencia.      We have left voicemails on the following phone numbers: [phone] and have sent a Mychart questionnaire..    Dates contacted: 11/30, 12/11, 3/27, 4/15  Last scheduled delivery: 10/10/22 for 28 days    The patient may be at risk of non-compliance with this medication. The patient should call the [facility] at [phone]  Option 4, then Option 2: Dermatology, Gastroenterology, Rheumatology to refill medication.    [name], PharmD   [facility] Specialty Pharmacist

## 2023-04-11 ENCOUNTER — Ambulatory Visit
Admit: 2023-04-11 | Discharge: 2023-04-11 | Payer: PRIVATE HEALTH INSURANCE | Attending: Student in an Organized Health Care Education/Training Program | Primary: Student in an Organized Health Care Education/Training Program

## 2023-04-11 ENCOUNTER — Ambulatory Visit: Admit: 2023-04-11 | Discharge: 2023-04-11 | Payer: PRIVATE HEALTH INSURANCE

## 2023-04-11 DIAGNOSIS — M0579 Rheumatoid arthritis with rheumatoid factor of multiple sites without organ or systems involvement: Principal | ICD-10-CM

## 2023-04-11 DIAGNOSIS — M329 Systemic lupus erythematosus, unspecified: Principal | ICD-10-CM

## 2023-04-11 DIAGNOSIS — O99891 Systemic lupus erythematosus affecting pregnancy (CMS-HCC): Principal | ICD-10-CM

## 2023-04-11 LAB — CREATININE
CREATININE: 0.48 mg/dL — ABNORMAL LOW
EGFR CKD-EPI (2021) FEMALE: >90 mL/min/{1.73_m2} (ref >=60)

## 2023-04-11 LAB — SEDIMENTATION RATE: ERYTHROCYTE SEDIMENTATION RATE: 78 mm/h — ABNORMAL HIGH (ref 0–20)

## 2023-04-11 LAB — CBC W/ AUTO DIFF
BASOPHILS ABSOLUTE COUNT: 0.1 10*9/L (ref 0.0–0.1)
BASOPHILS RELATIVE PERCENT: 1 %
EOSINOPHILS ABSOLUTE COUNT: 0.1 10*9/L (ref 0.0–0.5)
EOSINOPHILS RELATIVE PERCENT: 1.9 %
HEMATOCRIT: 35.1 % (ref 34.0–44.0)
HEMOGLOBIN: 11.9 g/dL (ref 11.3–14.9)
LYMPHOCYTES ABSOLUTE COUNT: 1.9 10*9/L (ref 1.1–3.6)
LYMPHOCYTES RELATIVE PERCENT: 36.7 %
MEAN CORPUSCULAR HEMOGLOBIN CONC: 33.8 g/dL (ref 32.0–36.0)
MEAN CORPUSCULAR HEMOGLOBIN: 30.3 pg (ref 25.9–32.4)
MEAN CORPUSCULAR VOLUME: 89.5 fL (ref 77.6–95.7)
MEAN PLATELET VOLUME: 8.2 fL (ref 6.8–10.7)
MONOCYTES ABSOLUTE COUNT: 0.5 10*9/L (ref 0.3–0.8)
MONOCYTES RELATIVE PERCENT: 9.2 %
NEUTROPHILS ABSOLUTE COUNT: 2.7 10*9/L (ref 1.8–7.8)
NEUTROPHILS RELATIVE PERCENT: 51.2 %
PLATELET COUNT: 248 10*9/L (ref 150–450)
RED BLOOD CELL COUNT: 3.92 10*12/L — ABNORMAL LOW (ref 3.95–5.13)
RED CELL DISTRIBUTION WIDTH: 13.5 % (ref 12.2–15.2)
WBC ADJUSTED: 5.3 10*9/L (ref 3.6–11.2)

## 2023-04-11 LAB — URINALYSIS WITH MICROSCOPY
BILIRUBIN UA: NEGATIVE
BLOOD UA: NEGATIVE
GLUCOSE UA: NEGATIVE
KETONES UA: NEGATIVE
NITRITE UA: NEGATIVE
PH UA: 7 (ref 5.0–9.0)
PROTEIN UA: NEGATIVE
RBC UA: <1 /HPF (ref <4)
SPECIFIC GRAVITY UA: 1.015 (ref 1.005–1.030)
SQUAMOUS EPITHELIAL: 3 /HPF (ref 0–5)
UROBILINOGEN UA: 0.2
WBC UA: 3 /HPF (ref 0–5)

## 2023-04-11 LAB — HEPATIC FUNCTION PANEL
ALBUMIN: 3.2 g/dL — ABNORMAL LOW (ref 3.4–5.0)
ALKALINE PHOSPHATASE: 89 U/L (ref 46–116)
ALT (SGPT): 54 U/L — ABNORMAL HIGH (ref 10–49)
AST (SGOT): 58 U/L — ABNORMAL HIGH (ref <=34)
BILIRUBIN DIRECT: 0.2 mg/dL (ref 0.00–0.30)
BILIRUBIN TOTAL: 0.4 mg/dL (ref 0.3–1.2)
PROTEIN TOTAL: 8.4 g/dL — ABNORMAL HIGH (ref 5.7–8.2)

## 2023-04-11 LAB — C-REACTIVE PROTEIN: C-REACTIVE PROTEIN: <4 mg/L (ref <=10.0)

## 2023-04-11 LAB — C3 COMPLEMENT: C3 COMPLEMENT: 132 mg/dL (ref 90–170)

## 2023-04-11 LAB — PROTEIN / CREATININE RATIO, URINE
CREATININE, URINE: 172.8 mg/dL
PROTEIN URINE: 23.8 mg/dL
PROTEIN/CREAT RATIO, URINE: 0.138

## 2023-04-11 LAB — C4 COMPLEMENT: C4 COMPLEMENT: 28.2 mg/dL (ref 12.0–36.0)

## 2023-04-11 LAB — FERRITIN: FERRITIN: 42.6 ng/mL

## 2023-04-11 NOTE — Unmapped (Signed)
Rheumatology Clinic Follow-up Note     Assessment/Plan:    Mikayla Hernandez is a 32 y.o.  female with a PMHx of SLE/RA overlap who presents today for follow-up of SLE/RA overlap.    Serologies: +ANA, +RNP, +SSA, +RF (147), +CCP (>340)  Manifestations: inflammatory arthritis, malar rash, oral ulcers  Current Treatment: HCQ (has come off [place] and Orencia since last visit)    SLE/RA overlap: First time meeting her myself today. 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 Unfortunately we have had challenges maintaining therapy (lost to follow up, difficulty obtaining follow up labs, pregnancy limited treatment before). Now returning today in setting of being off therapy with CDAI of 29.    We discussed options today and in light of her challenges with follow up and coordinating medication we will proceed with Rituximab infusion. Reviewed dosing, route, adverse effect profile, and recommendation to update vaccines prior to starting (PCV 20 and COVID). Patient is amenable.  - Start Rituximab 1g x2  - Cont HCQ 200mg  daily  - CBC w/ diff, Cr, hepatic panel  - C3, C4, dsDNA  - ESR, CRP  - U/A and UPC    Swollen Joint Count (0-28): 7  Tender Joint Count (0-28): 10  Patient Global Assessment of Disease Activity (0-10): 6  Evaluator Global Assessment of Disease (0-10): 6  CDAI Score: 29  CDAI interpretation:  0.0-2.8 Remission   2.9-10.0 Low disease activity   10.1-22.0 Moderate disease activity   22.1-76.0 High disease activity       Return in about 3 months (around 07/12/2023).    Fleeta Emmer, MD  Assistant Professor of Medicine  Department of Medicine/Division of Rheumatology  [facility]    Primary Care Provider: [facility], [facility]    HPI:  Mikayla Hernandez is a 32 y.o.  female with a PMHx of SLE/RA overlap who presents today for follow-up of SLE/RA overlap.    Today  Reports that she feels her joints are more active. Notes she has lost some ROM of fingers. Has not been on Orencia for a month or so because it never came. Reports adherence to leflunomide and HCQ. Feels its just her RA that is active as only her joints bother her, has not had rashes or oral ulcers like she did before. Otherwise well.    History  She established 02/23/20 with Dr. Marland Mcalpine. Patient was diagnosed at outside clinic in Nov 2019 and initial presenting symptoms were numbness/tingling in her b/l hands, b/l wrist pain and swelling, malar rash. Patient also developed knee/shoulder pain. ANA 1:1280, positive Sm, RNP, SSA, CCP, RF, and cardiolipin IgM with negative dsDNA and normal complements. She has previously tried hydroxychloroquine for periods of time in the past without much symptomatic relief.      Review of Systems:  Positive findings noted above, otherwise a 14 point review of systems was reviewed and negative    Past Medical, Surgical, Family and Social History reviewed and updated per EMR     Allergies:  Prednisone    Medications:     Current Outpatient Medications:     abatacept (ORENCIA CLICKJECT) 125 mg/mL AtIn subcutaneous auto-injector, Inject the contents of 1 auto-injector (125 mg total) under the skin every seven (7) days., Disp: 4 mL, Rfl: 6    acetaminophen (TYLENOL) 325 MG tablet, Take 2 tablets (650 mg total) by mouth Every six (6) hours., Disp: 120 tablet, Rfl: 0    empty container Misc, USE AS DIRECTED,  Disp: 1 each, Rfl: 2    famotidine (PEPCID) 20 MG tablet, , Disp: , Rfl:     hydrOXYchloroQUINE (PLAQUENIL) 200 mg tablet, Take 1 tablet (200 mg total) by mouth daily. Take one tablet daily, Disp: 90 tablet, Rfl: 4    ibuprofen (MOTRIN) 600 MG tablet, Take 1 tablet (600 mg total) by mouth Every six (6) hours. (Patient taking differently: Take 1 tablet (600 mg total) by mouth every six (6) hours. Is taking 800mg ), Disp: 60 tablet, Rfl: 0    leflunomide (ARAVA) 10 MG tablet, Take 1 tablet (10 mg total) by mouth daily., Disp: 30 tablet, Rfl: 1    multivitamin, prenatal, folic acid-iron, 27-1 mg Tab, Take 1 tablet by mouth daily., Disp: , Rfl:     amLODIPine-benazepril (LOTREL) 5-10 mg per capsule, Take 1 capsule by mouth daily., Disp: 30 capsule, Rfl: 5    Current Facility-Administered Medications:     methylPREDNISolone acetate (DEPO-MEDROL) injection 80 mg, 80 mg, Intramuscular, Once, Rockwell [place], FNP      Objective   Vitals:    04/11/23 1457   BP: 114/75   BP Site: L Arm   BP Position: Sitting   BP Cuff Size: Medium   Pulse: 88   Temp: 36.6 ??C (97.9 ??F)   TempSrc: Temporal   Weight: 49.6 kg (109 lb 6.4 oz)       Physical Exam  General: well appearing, no acute distress  Eyes: EOMI, normal conjunctivae   ENT: MMM.  Oropharynx without any erythema or exudate.  No oral or nasal ulcers.  Neck: supple. No cervical lymphadenopathy  Cardiovascular: Regular rate and rhythm. Nl S1 and S2.  Pulmonary: Nl WOB on RA, CTAB.  Skin: no rash, lesions, breakdown. No purpura or petechiae. No digital ulcers.   Extremities: Warm and well perfused, no cyanosis, clubbing or edema  Musculoskeletal: Swelling with TTP of the 3rd PIP bilaterally, 2-3 MCP on R and 2nd MCP on L. Wrists are also swollen with TTP. Unable to make a fist, wrist ROM is severely reduced. Otherwise, no synovitis or tenderness to palpation in the hands, wrists, elbows, shoulders, knees, ankles, or feet. Full ROM throughout.   Neurologic: Cranial nerves grossly intact, strength 5/5 throughout.  Normal sensation  Psychiatric: Normal mood and affect.    I personally spent 38 minutes face-to-face and non-face-to-face in the care of this patient, which includes all pre, intra, and post visit time on the date of service.  All documented time was specific to the E/M visit and does not include any procedures that may have been performed.

## 2023-04-11 NOTE — Unmapped (Signed)
We will plan to restart the Orencia. We will also get labs and Xray your hands today.

## 2023-04-12 DIAGNOSIS — M0579 Rheumatoid arthritis with rheumatoid factor of multiple sites without organ or systems involvement: Secondary | ICD-10-CM | POA: Insufficient documentation

## 2023-04-12 DIAGNOSIS — M329 Systemic lupus erythematosus, unspecified: Principal | ICD-10-CM

## 2023-04-12 DIAGNOSIS — O99891 Systemic lupus erythematosus affecting pregnancy (CMS-HCC): Principal | ICD-10-CM

## 2023-04-12 MED ORDER — HYDROXYCHLOROQUINE 200 MG TABLET
ORAL_TABLET | Freq: Every day | ORAL | 4 refills | 90 days | Status: CP
Start: 2023-04-12 — End: ?

## 2023-04-16 LAB — ANTI-DNA ANTIBODY, DOUBLE-STRANDED: DSDNA ANTIBODY: NEGATIVE

## 2023-04-16 MED ORDER — ORENCIA CLICKJECT 125 MG/ML SUBCUTANEOUS AUTO-INJECTOR
SUBCUTANEOUS | 3 refills | 28 days
Start: 2023-04-16 — End: ?

## 2023-04-16 NOTE — Unmapped (Signed)
Specialty Medication(s): Orencia    Ms.Berni has been dis-enrolled from the [facility] specialty pharmacy services due to  medication discontinued .    Additional information provided to the patient: n/a    [name], PharmD  [facility] Specialty Pharmacist

## 2023-04-16 NOTE — Unmapped (Unsigned)
Orencia refill  Last Visit Date: 04/11/2023  Next Visit Date: 07/23/2023    Lab Results   Component Value Date    ALT 54 (H) 04/11/2023    AST 58 (H) 04/11/2023    ALBUMIN 3.2 (L) 04/11/2023    CREATININE 0.48 (L) 04/11/2023     Lab Results   Component Value Date    WBC 5.3 04/11/2023    HGB 11.9 04/11/2023    HCT 35.1 04/11/2023    PLT 248 04/11/2023     Lab Results   Component Value Date    NEUTROPCT 51.2 04/11/2023    LYMPHOPCT 36.7 04/11/2023    MONOPCT 9.2 04/11/2023    EOSPCT 1.9 04/11/2023    BASOPCT 1.0 04/11/2023

## 2023-04-17 MED ORDER — ORENCIA CLICKJECT 125 MG/ML SUBCUTANEOUS AUTO-INJECTOR
SUBCUTANEOUS | 3 refills | 28 days | Status: CP
Start: 2023-04-17 — End: ?
  Filled 2023-04-25: qty 4, 28d supply, fill #0

## 2023-04-18 NOTE — Unmapped (Signed)
[facility] Specialty Medication Onboarding    Specialty Medication: ORENCIA CLICKJECT 125 mg/mL Atin subcutaneous auto-injector (abatacept)  Prior Authorization: Approved   Financial Assistance: No - copay  <$25  Final Copay/Day Supply: $4 / 28 days    Insurance Restrictions: None     Notes to Pharmacist: n/a  Credit Card on File: no    The triage team has completed the benefits investigation and has determined that the patient is able to fill this medication at [facility]. Please contact the patient to complete the onboarding or follow up with the prescribing physician as needed.

## 2023-04-19 NOTE — Unmapped (Unsigned)
Specialty Medication(s): Orencia    Mikayla Hernandez has been dis-enrolled from the [facility] specialty pharmacy services due to  only need for 1 mo .    Additional information provided to the patient: patient will use 1 more month of Orencia and then switch to infusion starting end of June , appointment already made.     [name], PharmD  [facility] Specialty Pharmacist        [facility]   Patient Onboarding/Medication Counseling    Mikayla Hernandez is a 32 y.o. female with SLE who I am counseling today on continuation of therapy.  I am speaking to the patient.    Was a Nurse, learning disability used for this call? No    Verified patient's date of birth / HIPAA.    Specialty medication(s) to be sent: Inflammatory Disorders: Orencia      Non-specialty medications/supplies to be sent: n/a      Medications not needed at this time: n/a         Orencia (abatacept)    The patient declined counseling on medication administration, missed dose instructions, goals of therapy, side effects and monitoring parameters, warnings and precautions, drug/food interactions, and storage, handling precautions, and disposal because they have taken the medication previously. The information in the declined sections below are for informational purposes only and was not discussed with patient.     Medication & Administration     Dosage:  Inject the contents of 1 auto-injector (125 mg total) under the skin every seven (7) days.    Lab tests required prior to treatment initiation:  Tuberculosis:  n/a .  Hepatitis B: Hepatitis B serology studies are complete and non-reactive.    Administration:       Systems developer all supplies needed for injection on a clean, flat working surface: medication pen removed from packaging, alcohol swab, sharps container, etc.  Look at the medication label - look for correct medication, correct dose, and check the expiration date  Look at the medication - the liquid visible in the window on the side of the pen device should appear clear and colorless to pale yellow  Lay the auto-injector pen on a flat surface and allow it to warm up to room temperature for at least 30 minutes  Select injection site - you can use the front of your thigh or your belly (but not the area 2 inches around your belly button); if someone else is giving you the injection you can also use your upper arm in the skin covering your triceps muscle  Prepare injection site - wash your hands and clean the skin at the injection site with an alcohol swab and let it air dry, do not touch the injection site again before the injection  Remove the orange safety cap straight off - do not remove until immediately prior to injection and do not touch the clear needle cover  Gently squeeze the area of cleaned skin and hold it firmly to create a firm surface at the selected injection site  Put the clear needle cover against your skin at the injection site at a 90 degree angle, hold the pen such that you can see the clear medication window  Press down and hold the pen firmly against your skin, press the blue activator button to initiate the injection, there will be a click when the injection starts  Continue to hold the pen firmly against your skin for about  15 seconds - the window will start to turn solid blue  To verify the injection is complete after 15 seconds, look and ensure the window is solid blue and then pull the pen away from your skin  Dispose of the used auto-injector pen immediately in your sharps disposal container the needle will be covered automatically  If you see any blood at the injection site, press a cotton ball or gauze on the site and maintain pressure until the bleeding stops, do not rub the injection site      Adherence/Missed dose instructions:  If your injection is given more than 2 days after your scheduled injection date - consult your pharmacist or prescriber for additional instructions on how to adjust your dosing schedule.      Goals of Therapy     Psoriatic arthritis  Achieve remission/inactive disease or low/minimal disease activity  Maintenance of function  Minimization of systemic manifestations and comorbidities  Maintenance of effective psychosocial functioning    Rheumatoid arthritis  Achieve symptom remission  Slow disease progression  Protection of remaining articular structures  Maintenance of function  Maintenance of effective psychosocial functioning      Side Effects & Monitoring Parameters     Injection site reaction (redness, irritation, inflammation localized to the site of administration)  Signs of a common cold - minor sore throat, runny or stuffy nose, etc.  Upset stomach  Headache    The following side effects should be reported to the provider:  Signs of a hypersensitivity reaction - rash; hives; itching; red, swollen, blistered, or peeling skin; wheezing; tightness in the chest or throat; difficulty breathing, swallowing, or talking; swelling of the mouth, face, lips, tongue, or throat; etc.  Reduced immune function - report signs of infection such as fever; chills; body aches; very bad sore throat; ear or sinus pain; cough; more sputum or change in color of sputum; pain with passing urine; wound that will not heal, etc.  Also at a slightly higher risk of some malignancies (mainly skin and blood cancers) due to this reduced immune function.  In the case of signs of infection - the patient should hold the next dose of Orencia?? and call your primary care provider to ensure adequate medical care.  Treatment may be resumed when infection is treated and patient is asymptomatic.  Changes in skin - a new growth or lump that forms; changes in shape, size, or color of a previous mole or marking  Signs of high or low blood pressure - very bad headache; dizziness; passing out; changes in eyesight      Contraindications, Warnings, & Precautions     Have your bloodwork checked as you have been told by your prescriber  Talk with your doctor if you are pregnant, planning to become pregnant, or breastfeeding  If concomitant COPD there may be an elevated risk of breathing problems  Discuss the possible need for holding your dose(s) of Orencia?? when a planned procedure is scheduled with the prescriber as it may delay healing/recovery timeline       Drug/Food Interactions     Medication list reviewed in Epic. The patient was instructed to inform the care team before taking any new medications or supplements. No drug interactions identified.   Talk with you prescriber or pharmacist before receiving any live vaccinations while taking this medication and after you stop taking it    Storage, Handling Precautions, & Disposal     Store this medication in the refrigerator.  Do not  freeze  If needed, you may store at room temperature for up to 8 hours  Store in original packaging, protected from light  Do not shake  Dispose of used syringes/pens in a sharps disposal container          Current Medications (including OTC/herbals), Comorbidities and Allergies     Current Outpatient Medications   Medication Sig Dispense Refill    abatacept (ORENCIA CLICKJECT) 125 mg/mL AtIn subcutaneous auto-injector Inject the contents of 1 auto-injector (125 mg total) under the skin every seven (7) days. 4 mL 3    acetaminophen (TYLENOL) 325 MG tablet Take 2 tablets (650 mg total) by mouth Every six (6) hours. 120 tablet 0    amLODIPine-benazepril (LOTREL) 5-10 mg per capsule Take 1 capsule by mouth daily. 30 capsule 5    empty container Misc USE AS DIRECTED 1 each 2    famotidine (PEPCID) 20 MG tablet       hydroxychloroquine (PLAQUENIL) 200 mg tablet Take 1 tablet (200 mg total) by mouth daily. Take one tablet daily 90 tablet 4    ibuprofen (MOTRIN) 600 MG tablet Take 1 tablet (600 mg total) by mouth Every six (6) hours. (Patient taking differently: Take 1 tablet (600 mg total) by mouth every six (6) hours. Is taking 800mg ) 60 tablet 0    multivitamin, prenatal, folic acid-iron, 27-1 mg Tab Take 1 tablet by mouth daily.       Current Facility-Administered Medications   Medication Dose Route Frequency Provider Last Rate Last Admin    methylPREDNISolone acetate (DEPO-MEDROL) injection 80 mg  80 mg Intramuscular Once Rockwell Alexandria, FNP           Allergies   Allergen Reactions    Prednisone Hives     Tolerated methylprednisolone       Patient Active Problem List   Diagnosis    Supervision of other high risk pregnancies, third trimester    Systemic lupus erythematosus affecting pregnancy (CMS-HCC)    Previous cesarean delivery     History of neonatal death    Pyelonephritis affecting pregnancy    Gestational hypertension, third trimester    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    Appropriateness of Therapy     Acute infections noted within Epic:  No active infections  Patient reported infection: None    Is medication and dose appropriate based on diagnosis and infection status? Yes    Prescription has been clinically reviewed: Yes      Baseline Quality of Life Assessment      How many days over the past month did your condition  keep you from your normal activities? For example, brushing your teeth or getting up in the morning. Patient declined to answer    Financial Information     Medication Assistance provided: Prior Authorization    Anticipated copay of $4/28 days reviewed with patient. Verified delivery address.    Delivery Information     Scheduled delivery date: 5/24    Expected start date: 5/30      Medication will be delivered via UPS to the prescription address in [facility].  This shipment will not require a signature.      Explained the services we provide at [facility] and that each month we would call to set up refills.  Stressed importance of returning phone calls so that we could ensure they  receive their medications in time each month.  Informed patient that we should be setting up refills 7-10 days prior to when they will run out of medication.  A pharmacist will reach out to perform a clinical assessment periodically.  Informed patient that a welcome packet, containing information about our pharmacy and other support services, a Notice of Privacy Practices, and a drug information handout will be sent.      The patient or caregiver noted above participated in the development of this care plan and knows that they can request review of or adjustments to the care plan at any time.      Patient or caregiver verbalized understanding of the above information as well as how to contact the pharmacy at [phone] option 4 with any questions/concerns.  The pharmacy is open Monday through Friday 8:30am-4:30pm.  A pharmacist is available 24/7 via pager to answer any clinical questions they may have.    Patient Specific Needs     Does the patient have any physical, cognitive, or cultural barriers? No    Does the patient have adequate living arrangements? (i.e. the ability to store and take their medication appropriately) Yes    Did you identify any home environmental safety or security hazards? No    Patient prefers to have medications discussed with  Patient     Is the patient or caregiver able to read and understand education materials at a high school level or above? Yes    Patient's primary language is  English     Is the patient high risk? No    SOCIAL DETERMINANTS OF HEALTH     At the [facility], we have learned that life circumstances - like trouble affording food, housing, utilities, or transportation can affect the health of many of our patients.   That is why we wanted to ask: are you currently experiencing any life circumstances that are negatively impacting your health and/or quality of life? Patient declined to answer    Social Determinants of Health     Financial Resource Strain: Not on file   Internet Connectivity: Not on file   Food Insecurity: Not on file   Tobacco Use: Medium Risk (04/11/2023)    Patient History     Smoking Tobacco Use: Former     Smokeless Tobacco Use: Never     Passive Exposure: Not on file   Housing/Utilities: Not on file   Alcohol Use: Not At Risk (04/14/2018)    Received from [facility]    AUDIT-C     Frequency of Alcohol Consumption: Never     Average Number of Drinks: Not on file     Frequency of Binge Drinking: Not on file   Transportation Needs: Not on file   Substance Use: Not on file   Health Literacy: Not on file   Physical Activity: Not on file   Interpersonal Safety: Not on file   Stress: Not on file   Intimate Partner Violence: Not on file   Depression: Not at risk (04/09/2022)    Received from [facility]    PHQ-2     Patient Health Questionnaire-2 Score: 0   Social Connections: Not on file       Would you be willing to receive help with any of the needs that you have identified today? Not applicable       [name], PharmD  [facility] Specialty Pharmacist you currently experiencing any life circumstances that are  negatively impacting your health and/or quality of life? {YES/NO/PATIENTDECLINED:93004}    Social Determinants of Health     Financial Resource Strain: Not on file   Internet Connectivity: Not on file   Food Insecurity: Not on file   Tobacco Use: Medium Risk (04/11/2023)    Patient History     Smoking Tobacco Use: Former     Smokeless Tobacco Use: Never     Passive Exposure: Not on file   Housing/Utilities: Not on file   Alcohol Use: Not At Risk (04/14/2018)    Received from [facility]    AUDIT-C     Frequency of Alcohol Consumption: Never     Average Number of Drinks: Not on file     Frequency of Binge Drinking: Not on file   Transportation Needs: Not on file   Substance Use: Not on file   Health Literacy: Not on file   Physical Activity: Not on file   Interpersonal Safety: Not on file   Stress: Not on file   Intimate Partner Violence: Not on file   Depression: Not at risk (04/09/2022)    Received from [facility]    PHQ-2     Patient Health Questionnaire-2 Score: 0   Social Connections: Not on file       Would you be willing to receive help with any of the needs that you have identified today? {Yes/No/Not applicable:93005}       [name], PharmD  [facility] Specialty Pharmacist

## 2023-05-31 IMAGING — US US RENAL
1 series · 15 of 25 positions shown · non-contrast
Comparison: None.

CLINICAL DATA: Pyelonephritis

EXAM:
RENAL / URINARY TRACT ULTRASOUND COMPLETE

[Series 1: us renal · 15 of 35 slices shown]
[im 1/35]
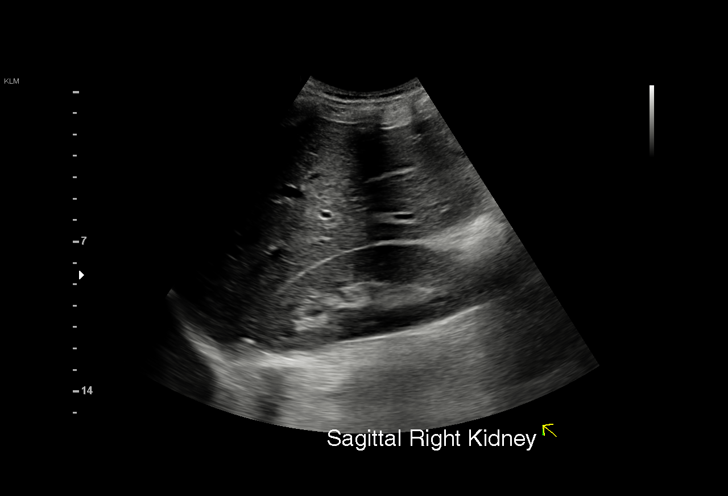
[im 3/35]
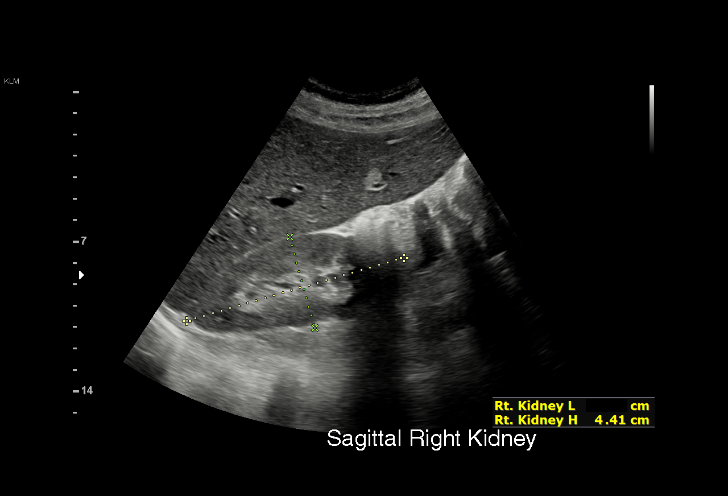
[im 6/35]
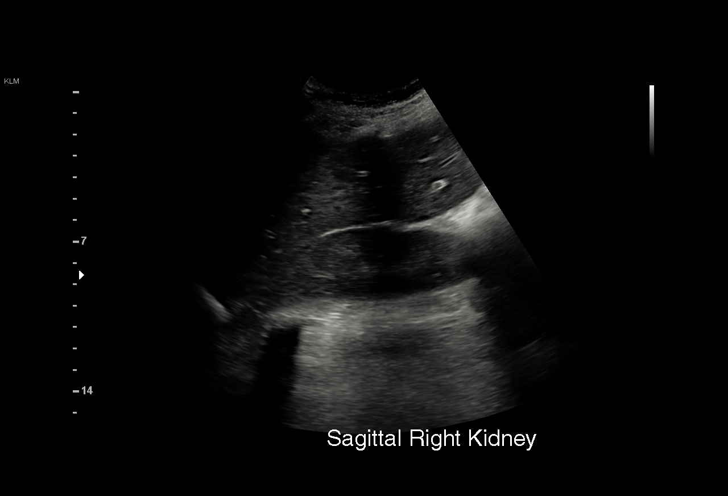
[im 8/35]
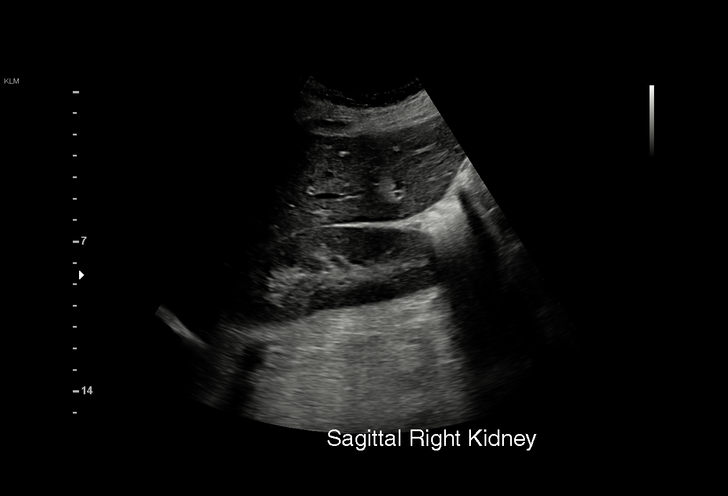
[im 10/35]
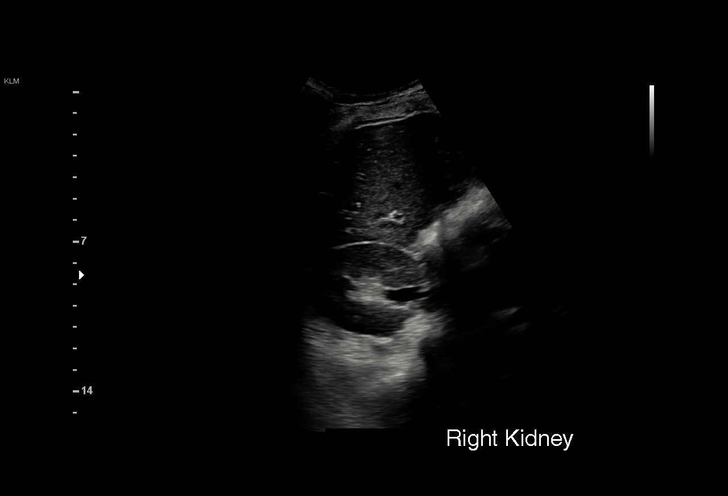
[im 13/35]
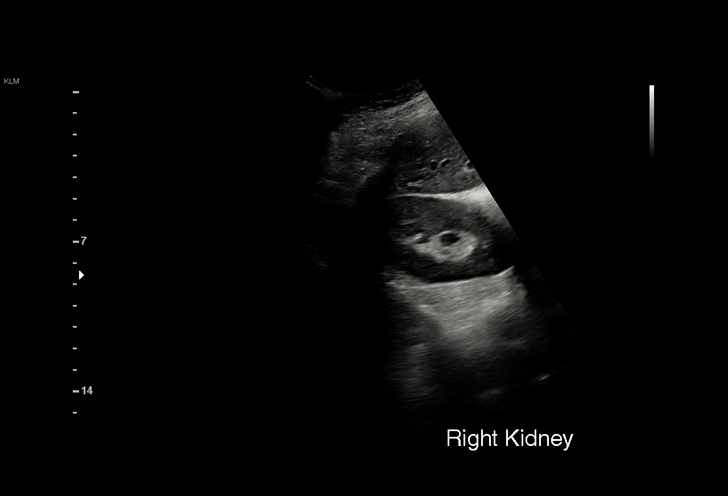
[im 15/35]
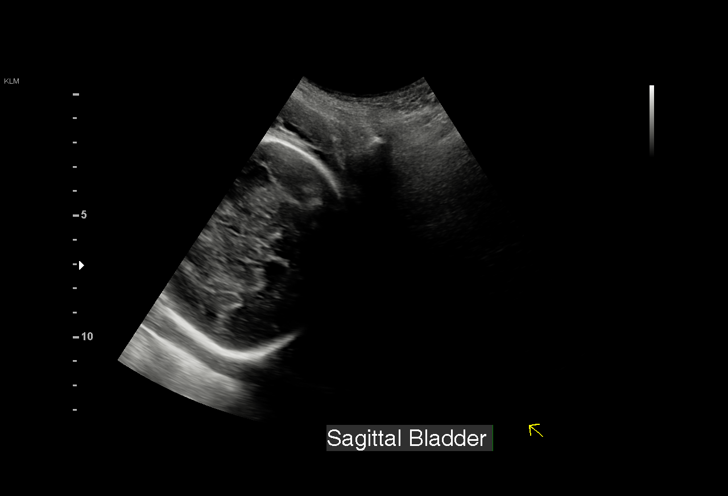
[im 18/35]
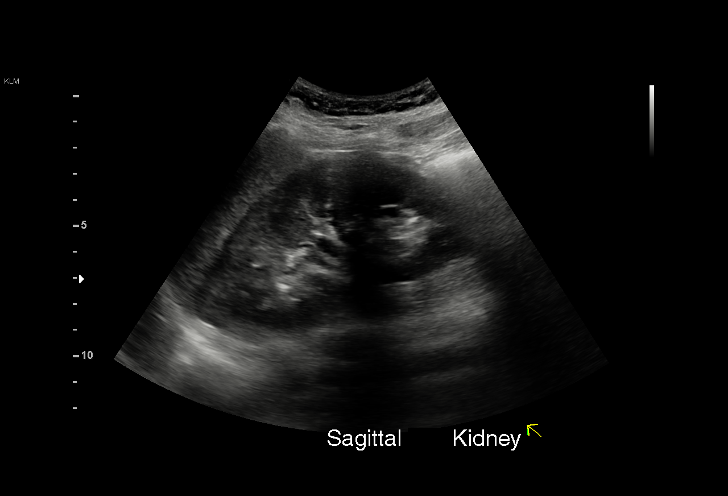
[im 20/35]
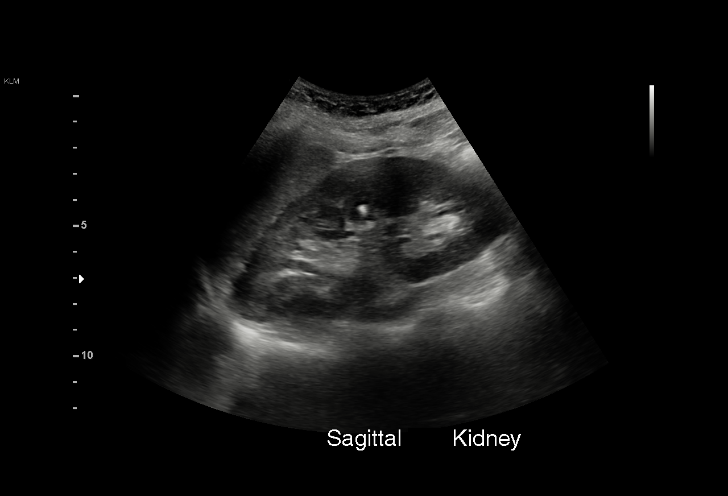
[im 22/35]
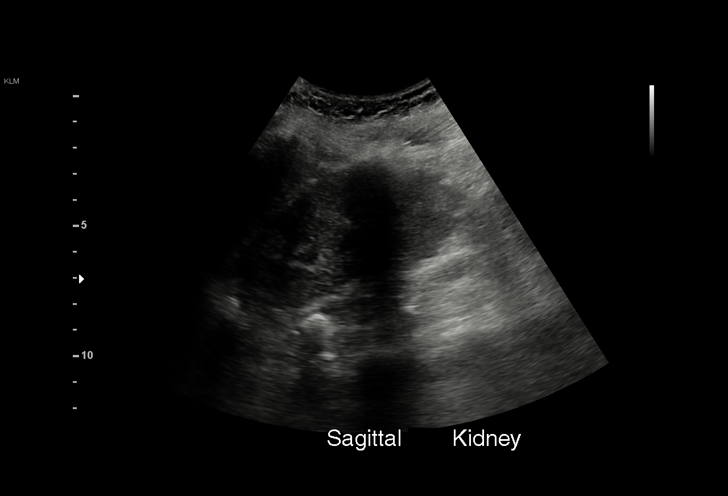
[im 25/35]
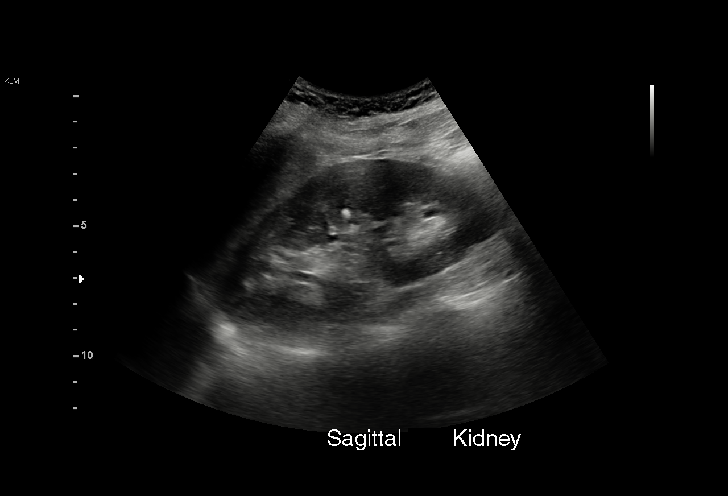
[im 27/35]
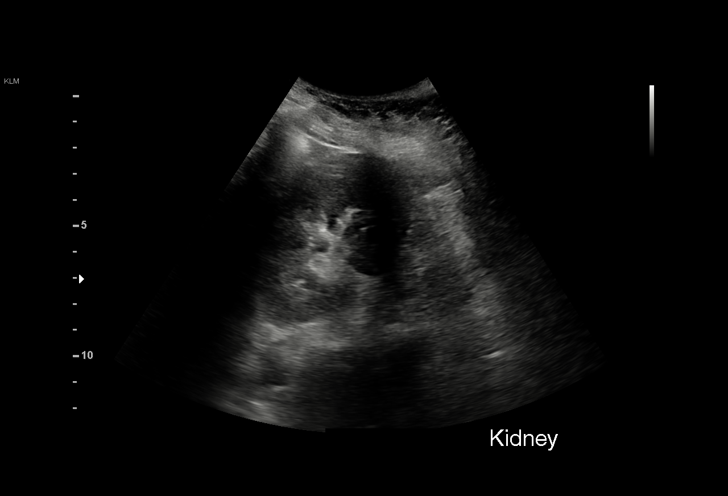
[im 29/35]
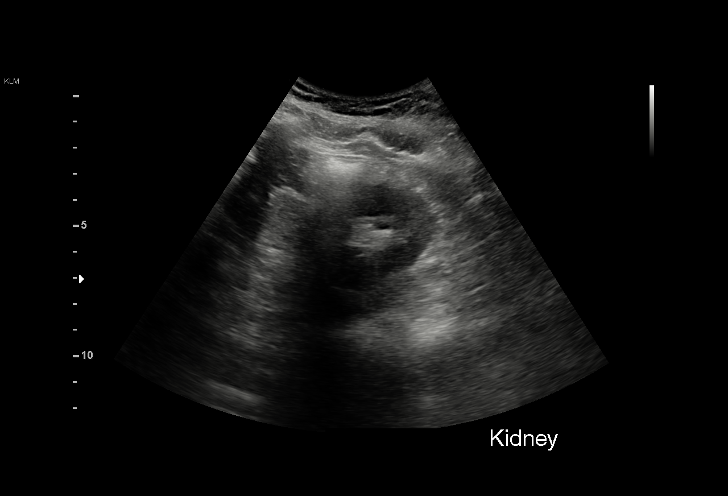
[im 32/35]
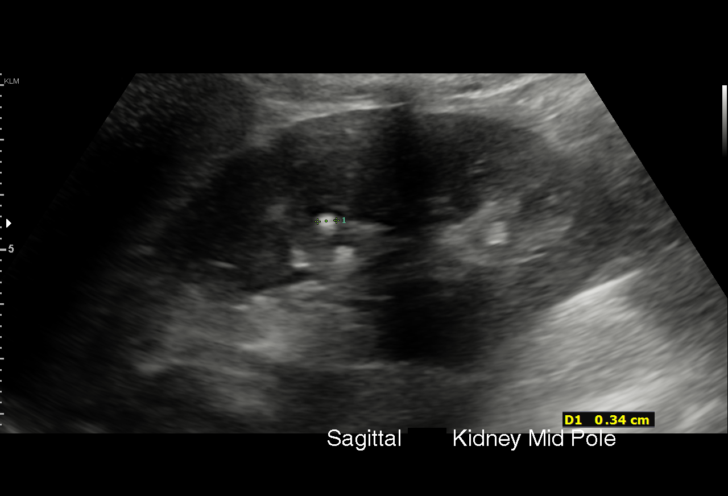
[im 35/35]
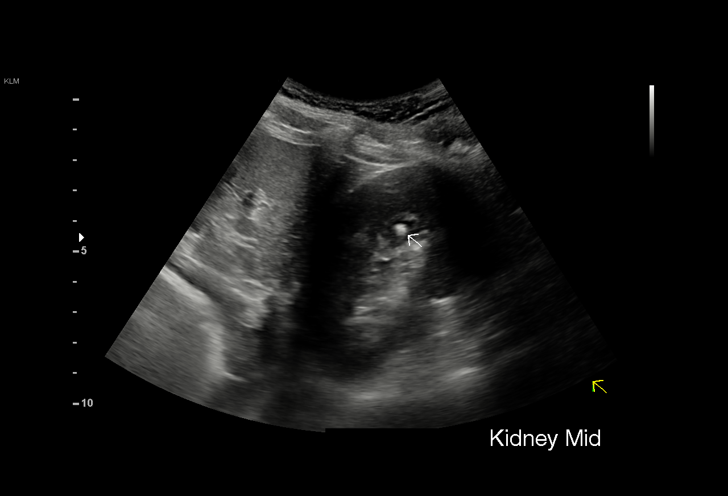

[15 of 25 positions shown; findings below may reference images not displayed]

FINDINGS: Right Kidney:

Renal measurements: 10.6 x 4.4 x 5.4 cm = volume: 131.8 mL.
Echogenicity within normal limits. No mass or hydronephrosis
visualized.

Left Kidney:

Renal measurements: 11.1 x 5.3 x 5.5 cm = volume: 167.5 mL. There is
no hydronephrosis. There is 3 mm hyperechoic focus in the midportion
of right kidney. There is no acoustic shadowing. There is no
perinephric fluid collection.

Bladder:

Urinary bladder is empty and not optimally evaluated.

Other:

None.
IMPRESSION: There is no hydronephrosis. There is 3 mm hyperechoic focus in the
midportion of left kidney. This may suggest a nonobstructing renal
stone or partial volume averaging of renal sinus fat.

## 2023-06-09 DIAGNOSIS — O99891 Systemic lupus erythematosus affecting pregnancy (CMS-HCC): Principal | ICD-10-CM

## 2023-06-09 DIAGNOSIS — M0579 Rheumatoid arthritis with rheumatoid factor of multiple sites without organ or systems involvement: Principal | ICD-10-CM

## 2023-06-09 DIAGNOSIS — M329 Systemic lupus erythematosus, unspecified: Principal | ICD-10-CM

## 2023-06-20 DIAGNOSIS — M329 Systemic lupus erythematosus, unspecified: Principal | ICD-10-CM

## 2023-06-20 DIAGNOSIS — M0579 Rheumatoid arthritis with rheumatoid factor of multiple sites without organ or systems involvement: Principal | ICD-10-CM

## 2023-06-20 DIAGNOSIS — M1990 Unspecified osteoarthritis, unspecified site: Principal | ICD-10-CM

## 2023-06-20 MED ORDER — ORENCIA CLICKJECT 125 MG/ML SUBCUTANEOUS AUTO-INJECTOR
SUBCUTANEOUS | 3 refills | 84 days
Start: 2023-06-20 — End: ?

## 2023-06-24 DIAGNOSIS — M0579 Rheumatoid arthritis with rheumatoid factor of multiple sites without organ or systems involvement: Principal | ICD-10-CM

## 2023-06-24 DIAGNOSIS — O99891 Systemic lupus erythematosus affecting pregnancy (CMS-HCC): Principal | ICD-10-CM

## 2023-06-24 DIAGNOSIS — M329 Systemic lupus erythematosus, unspecified: Principal | ICD-10-CM

## 2023-06-24 MED ORDER — ORENCIA CLICKJECT 125 MG/ML SUBCUTANEOUS AUTO-INJECTOR
SUBCUTANEOUS | 3 refills | 84 days | Status: CP
Start: 2023-06-24 — End: ?
  Filled 2023-06-26: qty 4, 28d supply, fill #0

## 2023-06-24 MED ORDER — METHYLPREDNISOLONE 4 MG TABLET
ORAL_TABLET | ORAL | 0 refills | 22 days | Status: CP
Start: 2023-06-24 — End: 2023-07-16

## 2023-06-25 NOTE — Unmapped (Signed)
[facility] Specialty Medication Onboarding    Specialty Medication: ORENCIA CLICKJECT 125 mg/mL Atin subcutaneous auto-injector (abatacept)  Prior Authorization: Approved   Financial Assistance: No - copay  <$25  Final Copay/Day Supply: $4 / 28 days    Insurance Restrictions: None     Notes to Pharmacist:   Credit Card on File: yes    The triage team has completed the benefits investigation and has determined that the patient is able to fill this medication at [facility]. Please contact the patient to complete the onboarding or follow up with the prescribing physician as needed.

## 2023-06-25 NOTE — Unmapped (Signed)
[facility]   Patient Onboarding/Medication Counseling    -Patient has been on medication for 2 months.     Mikayla Hernandez is a 32 y.o. female with SLE who I am counseling today on continuation of therapy.  I am speaking to the patient.    Was a Nurse, learning disability used for this call? No    Verified patient's date of birth / HIPAA.    Specialty medication(s) to be sent: Inflammatory Disorders: Orencia      Non-specialty medications/supplies to be sent: n/a      Medications not needed at this time: n/a         Orencia (abatacept)    The patient declined counseling on medication administration, missed dose instructions, goals of therapy, side effects and monitoring parameters, warnings and precautions, drug/food interactions, and storage, handling precautions, and disposal because they have taken the medication previously. The information in the declined sections below are for informational purposes only and was not discussed with patient.     Medication & Administration     Dosage:  Inject the contents of 1 auto-injector (125 mg total) under the skin every seven (7) days.    Lab tests required prior to treatment initiation:  Tuberculosis: Tuberculosis screening resulted in a non-reactive Quantiferon TB Gold assay.  Hepatitis B: Hepatitis B serology studies are complete and non-reactive.    Administration:         Systems developer all supplies needed for injection on a clean, flat working surface: medication pen removed from packaging, alcohol swab, sharps container, etc.  Look at the medication label - look for correct medication, correct dose, and check the expiration date  Look at the medication - the liquid visible in the window on the side of the pen device should appear clear and colorless to pale yellow  Lay the auto-injector pen on a flat surface and allow it to warm up to room temperature for at least 30 minutes  Select injection site - you can use the front of your thigh or your belly (but not the area 2 inches around your belly button); if someone else is giving you the injection you can also use your upper arm in the skin covering your triceps muscle  Prepare injection site - wash your hands and clean the skin at the injection site with an alcohol swab and let it air dry, do not touch the injection site again before the injection  Remove the orange safety cap straight off - do not remove until immediately prior to injection and do not touch the clear needle cover  Gently squeeze the area of cleaned skin and hold it firmly to create a firm surface at the selected injection site  Put the clear needle cover against your skin at the injection site at a 90 degree angle, hold the pen such that you can see the clear medication window  Press down and hold the pen firmly against your skin, press the blue activator button to initiate the injection, there will be a click when the injection starts  Continue to hold the pen firmly against your skin for about 15 seconds - the window will start to turn solid blue  To verify the injection is complete after 15 seconds, look and ensure the window is solid blue and then pull the pen away from your skin  Dispose of the used auto-injector pen immediately in your sharps disposal container the needle will be covered automatically  If you  see any blood at the injection site, press a cotton ball or gauze on the site and maintain pressure until the bleeding stops, do not rub the injection site      Adherence/Missed dose instructions:  If your injection is given more than 2 days after your scheduled injection date - consult your pharmacist or prescriber for additional instructions on how to adjust your dosing schedule.      Goals of Therapy     Psoriatic arthritis  Achieve remission/inactive disease or low/minimal disease activity  Maintenance of function  Minimization of systemic manifestations and comorbidities  Maintenance of effective psychosocial functioning    Rheumatoid arthritis  Achieve symptom remission  Slow disease progression  Protection of remaining articular structures  Maintenance of function  Maintenance of effective psychosocial functioning      Side Effects & Monitoring Parameters     Injection site reaction (redness, irritation, inflammation localized to the site of administration)  Signs of a common cold - minor sore throat, runny or stuffy nose, etc.  Upset stomach  Headache    The following side effects should be reported to the provider:  Signs of a hypersensitivity reaction - rash; hives; itching; red, swollen, blistered, or peeling skin; wheezing; tightness in the chest or throat; difficulty breathing, swallowing, or talking; swelling of the mouth, face, lips, tongue, or throat; etc.  Reduced immune function - report signs of infection such as fever; chills; body aches; very bad sore throat; ear or sinus pain; cough; more sputum or change in color of sputum; pain with passing urine; wound that will not heal, etc.  Also at a slightly higher risk of some malignancies (mainly skin and blood cancers) due to this reduced immune function.  In the case of signs of infection - the patient should hold the next dose of Orencia?? and call your primary care provider to ensure adequate medical care.  Treatment may be resumed when infection is treated and patient is asymptomatic.  Changes in skin - a new growth or lump that forms; changes in shape, size, or color of a previous mole or marking  Signs of high or low blood pressure - very bad headache; dizziness; passing out; changes in eyesight      Contraindications, Warnings, & Precautions     Have your bloodwork checked as you have been told by your prescriber  Talk with your doctor if you are pregnant, planning to become pregnant, or breastfeeding  If concomitant COPD there may be an elevated risk of breathing problems  Discuss the possible need for holding your dose(s) of Orencia?? when a planned procedure is scheduled with the prescriber as it may delay healing/recovery timeline       Drug/Food Interactions     Medication list reviewed in Epic. The patient was instructed to inform the care team before taking any new medications or supplements. No drug interactions identified.   Talk with you prescriber or pharmacist before receiving any live vaccinations while taking this medication and after you stop taking it    Storage, Handling Precautions, & Disposal     Store this medication in the refrigerator.  Do not freeze  If needed, you may store at room temperature for up to 8 hours  Store in original packaging, protected from light  Do not shake  Dispose of used syringes/pens in a sharps disposal container          Current Medications (including OTC/herbals), Comorbidities and Allergies     Current Outpatient Medications  Medication Sig Dispense Refill    abatacept (ORENCIA CLICKJECT) 125 mg/mL AtIn subcutaneous auto-injector Inject the contents of 1 auto-injector (125 mg total) under the skin every seven (7) days. 12 mL 3    acetaminophen (TYLENOL) 325 MG tablet Take 2 tablets (650 mg total) by mouth Every six (6) hours. 120 tablet 0    amLODIPine-benazepril (LOTREL) 5-10 mg per capsule Take 1 capsule by mouth daily. 30 capsule 5    empty container Misc USE AS DIRECTED 1 each 2    famotidine (PEPCID) 20 MG tablet       hydroxychloroquine (PLAQUENIL) 200 mg tablet Take 1 tablet (200 mg total) by mouth daily. Take one tablet daily 90 tablet 4    ibuprofen (MOTRIN) 600 MG tablet Take 1 tablet (600 mg total) by mouth Every six (6) hours. (Patient taking differently: Take 1 tablet (600 mg total) by mouth every six (6) hours. Is taking 800mg ) 60 tablet 0    methylPREDNISolone (MEDROL) 4 MG tablet Take 4 tablets (16 mg total) by mouth daily for 4 days, THEN 3 tablets (12 mg total) daily for 4 days, THEN 2 tablets (8 mg total) daily for 7 days, THEN 1 tablet (4 mg total) daily for 7 days. 49 tablet 0 multivitamin, prenatal, folic acid-iron, 27-1 mg Tab Take 1 tablet by mouth daily.       Current Facility-Administered Medications   Medication Dose Route Frequency Provider Last Rate Last Admin    methylPREDNISolone acetate (DEPO-MEDROL) injection 80 mg  80 mg Intramuscular Once Rockwell Alexandria, FNP           Allergies   Allergen Reactions    Prednisone Hives     Tolerated methylprednisolone       Patient Active Problem List   Diagnosis    Supervision of other high risk pregnancies, third trimester    Systemic lupus erythematosus affecting pregnancy (CMS-HCC)    Previous cesarean delivery     History of neonatal death    Pyelonephritis affecting pregnancy    Gestational hypertension, third trimester    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    Appropriateness of Therapy     Acute infections noted within Epic:  No active infections  Patient reported infection: None    Is medication and dose appropriate based on diagnosis and infection status? Yes    Prescription has been clinically reviewed: Yes      Baseline Quality of Life Assessment      How many days over the past month did your SLE  keep you from your normal activities? For example, brushing your teeth or getting up in the morning. Patient declined to answer    Financial Information     Medication Assistance provided: None Required    Anticipated copay of $4/28 days reviewed with patient. Verified delivery address.    Delivery Information     Scheduled delivery date: 7/25    Expected start date: 7/25      Medication will be delivered via UPS to the prescription address in [facility].  This shipment will not require a signature.      Explained the services we provide at [facility] and that each month we would call to set up refills.  Stressed importance of returning phone calls so that we could ensure they receive their medications in time each month.  Informed patient that we  should be setting up refills 7-10 days prior to when they will run out of medication.  A pharmacist will reach out to perform a clinical assessment periodically.  Informed patient that a welcome packet, containing information about our pharmacy and other support services, a Notice of Privacy Practices, and a drug information handout will be sent.      The patient or caregiver noted above participated in the development of this care plan and knows that they can request review of or adjustments to the care plan at any time.      Patient or caregiver verbalized understanding of the above information as well as how to contact the pharmacy at [phone] option 4 with any questions/concerns.  The pharmacy is open Monday through Friday 8:30am-4:30pm.  A pharmacist is available 24/7 via pager to answer any clinical questions they may have.    Patient Specific Needs     Does the patient have any physical, cognitive, or cultural barriers? No    Does the patient have adequate living arrangements? (i.e. the ability to store and take their medication appropriately) Yes    Did you identify any home environmental safety or security hazards? No    Patient prefers to have medications discussed with  Patient     Is the patient or caregiver able to read and understand education materials at a high school level or above? Yes    Patient's primary language is  English     Is the patient high risk? No    SOCIAL DETERMINANTS OF HEALTH     At the [facility], we have learned that life circumstances - like trouble affording food, housing, utilities, or transportation can affect the health of many of our patients.   That is why we wanted to ask: are you currently experiencing any life circumstances that are negatively impacting your health and/or quality of life? Patient declined to answer    Social Determinants of Health     Financial Resource Strain: Not on file   Internet Connectivity: Not on file Food Insecurity: Not on file   Tobacco Use: Medium Risk (04/11/2023)    Patient History     Smoking Tobacco Use: Former     Smokeless Tobacco Use: Never     Passive Exposure: Not on file   Housing/Utilities: Not on file   Alcohol Use: Not At Risk (04/14/2018)    Received from [facility]    AUDIT-C     Frequency of Alcohol Consumption: Never     Average Number of Drinks: Not on file     Frequency of Binge Drinking: Not on file   Transportation Needs: Not on file   Substance Use: Not on file   Health Literacy: Not on file   Physical Activity: Not on file   Interpersonal Safety: Unknown (06/25/2023)    Interpersonal Safety     Unsafe Where You Currently Live: Not on file     Physically Hurt by Anyone: Not on file     Abused by Anyone: Not on file   Stress: Not on file   Intimate Partner Violence: Not on file   Depression: Not at risk (04/09/2022)    Received from [facility]    PHQ-2     Patient Health Questionnaire-2 Score: 0   Social Connections: Not on file       Would you be willing to receive help with any of the needs that you have identified today? Not applicable  [name], PharmD  [facility] Specialty Pharmacist

## 2023-07-01 ENCOUNTER — Ambulatory Visit: Admit: 2023-07-01 | Discharge: 2023-07-02 | Payer: PRIVATE HEALTH INSURANCE

## 2023-07-01 DIAGNOSIS — O99891 Systemic lupus erythematosus affecting pregnancy (CMS-HCC): Principal | ICD-10-CM

## 2023-07-01 DIAGNOSIS — M329 Systemic lupus erythematosus, unspecified: Principal | ICD-10-CM

## 2023-07-01 DIAGNOSIS — M0579 Rheumatoid arthritis with rheumatoid factor of multiple sites without organ or systems involvement: Principal | ICD-10-CM

## 2023-07-01 LAB — CBC W/ AUTO DIFF
BASOPHILS ABSOLUTE COUNT: 0 10*9/L (ref 0.0–0.1)
BASOPHILS RELATIVE PERCENT: 0.7 %
EOSINOPHILS ABSOLUTE COUNT: 0.1 10*9/L (ref 0.0–0.5)
EOSINOPHILS RELATIVE PERCENT: 1.9 %
HEMATOCRIT: 36.4 % (ref 34.0–44.0)
HEMOGLOBIN: 12.1 g/dL (ref 11.3–14.9)
LYMPHOCYTES ABSOLUTE COUNT: 2.1 10*9/L (ref 1.1–3.6)
LYMPHOCYTES RELATIVE PERCENT: 39.7 %
MEAN CORPUSCULAR HEMOGLOBIN CONC: 33.3 g/dL (ref 32.0–36.0)
MEAN CORPUSCULAR HEMOGLOBIN: 30.8 pg (ref 25.9–32.4)
MEAN CORPUSCULAR VOLUME: 92.3 fL (ref 77.6–95.7)
MEAN PLATELET VOLUME: 8 fL (ref 6.8–10.7)
MONOCYTES ABSOLUTE COUNT: 0.4 10*9/L (ref 0.3–0.8)
MONOCYTES RELATIVE PERCENT: 7 %
NEUTROPHILS ABSOLUTE COUNT: 2.7 10*9/L (ref 1.8–7.8)
NEUTROPHILS RELATIVE PERCENT: 50.7 %
PLATELET COUNT: 238 10*9/L (ref 150–450)
RED BLOOD CELL COUNT: 3.95 10*12/L (ref 3.95–5.13)
RED CELL DISTRIBUTION WIDTH: 13.5 % (ref 12.2–15.2)
WBC ADJUSTED: 5.4 10*9/L (ref 3.6–11.2)

## 2023-07-01 MED ADMIN — riTUXimab-abbs (TRUXIMA) 1,000 mg in sodium chloride (NS) 0.9 % 640 mL IVPB: 1000 mg | INTRAVENOUS | @ 16:00:00 | Stop: 2023-07-01 | NDC 53191040901

## 2023-07-01 MED ADMIN — acetaminophen (TYLENOL) tablet 650 mg: 650 mg | ORAL | @ 15:00:00 | Stop: 2023-07-01 | NDC 50580048790

## 2023-07-01 MED ADMIN — sodium chloride (NS) 0.9 % infusion: 20 mL/h | INTRAVENOUS | @ 15:00:00 | Stop: 2023-07-01 | NDC 05928011001

## 2023-07-01 MED ADMIN — methylPREDNISolone sodium succinate (PF) (SOLU-Medrol) injection 40 mg: 40 mg | INTRAVENOUS | @ 15:00:00 | Stop: 2023-07-01 | NDC 70121157305

## 2023-07-01 MED ADMIN — diphenhydrAMINE (BENADRYL) injection 25 mg: 25 mg | INTRAVENOUS | @ 15:00:00 | Stop: 2023-07-01 | NDC 97807007030

## 2023-07-02 NOTE — Unmapped (Signed)
5621 Patient presents for initial Truxima (rituximab-abbs) infusion.  In no acute distress.  Vitals stable.  Reports no new medical issues or S/S of infection.  IV started in R wrist.  See MAR for premeds.    WBC 5.4  Platelets 238    1132 Truxima (rituximab-abbs) 1000 mg infusing as follows:     50 mg/hr for 30 min   100 mg/hr for 30 min  150 mg/hr for 30 min  200 mg/hr for 30 min  250 mgl/hr for 30 min  300 mgl/hr for 30 min  350 mg/hr for 30 min  400 mg/hr for the rest of the infusion.    1552 Truxima (rituximab-abbs) infusion complete. Pt stated feeling a slight tickle in her throat towards end of the infusion (after pt ate sour candy). Tickle resolved after approx 6 minutes of additional fluid, VSS throughout. PIV flushed with NS.  Vitals stable.  Patient without any s/s of adverse reaction.    1630 IV d/c'd.  Patient discharged from Infusion Center in stable condition.

## 2023-07-17 NOTE — Unmapped (Signed)
[facility] Refill Coordination Note    Mikayla Hernandez, Mikayla Hernandez: 12/09/1990  Phone: There are no phone numbers on file.      All above HIPAA information was verified with patient.         07/16/2023     5:25 PM   Specialty Rx Medication Refill Questionnaire   Which Medications would you like refilled and shipped? Orencia   Please list all current allergies: None   Have you missed any doses in the last 30 days? No   Have you had any changes to your medication(s) since your last refill? No   How many days remaining of each medication do you have at home? 2   If receiving an injectable medication, next injection date is 07/18/2023   Have you experienced any side effects in the last 30 days? No   Please enter the full address (street address, city, state, zip code) where you would like your medication(s) to be delivered to. [address], [place] [address], [address]   Please specify on which day you would like your medication(s) to arrive. Note: if you need your medication(s) within 3 days, please call the pharmacy to schedule your order at [phone]  07/19/2023   Has your insurance changed since your last refill? No   Would you like a pharmacist to call you to discuss your medication(s)? No   Do you require a signature for your package? (Note: if we are billing Medicare Part B or your order contains a controlled substance, we will require a signature) No         Completed refill call assessment today to schedule patient's medication shipment from the [facility] [phone]).  All relevant notes have been reviewed.       Confirmed patient received a Conservation officer, historic buildings and a Surveyor, mining with first shipment. The patient will receive a drug information handout for each medication shipped and additional FDA Medication Guides as required.         REFERRAL TO PHARMACIST     Referral to the pharmacist: Not needed      [facility]     Shipping address confirmed in Epic. Delivery Scheduled: Yes, Expected medication delivery date: 07/19/23.     Medication will be delivered via UPS to the temporary address in Epic WAM.    Mikayla Hernandez   [facility] Specialty Technician

## 2023-07-18 MED FILL — ORENCIA CLICKJECT 125 MG/ML SUBCUTANEOUS AUTO-INJECTOR: SUBCUTANEOUS | 28 days supply | Qty: 4 | Fill #1

## 2023-07-19 NOTE — Unmapped (Unsigned)
Rheumatology Clinic Follow-up Note     Assessment/Plan:    Mikayla Hernandez is a 32 y.o.  female with a PMHx of SLE/RA overlap who presents today for follow-up of SLE/RA overlap.    ***    Serologies: +ANA, +RNP, +SSA, +RF (147), +CCP (>340)  Manifestations: inflammatory arthritis, malar rash, oral ulcers  Current Treatment: HCQ (has come off [place] and Orencia since last visit)    SLE/RA overlap: First time meeting her myself today. 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 Unfortunately we have had challenges maintaining therapy (lost to follow up, difficulty obtaining follow up labs, pregnancy limited treatment before). Now returning today in setting of being off therapy with CDAI of 29.    We discussed options today and in light of her challenges with follow up and coordinating medication we will proceed with Rituximab infusion. Reviewed dosing, route, adverse effect profile, and recommendation to update vaccines prior to starting (PCV 20 and COVID). Patient is amenable.  - Start Rituximab 1g x2  - Cont HCQ 200mg  daily  - CBC w/ diff, Cr, hepatic panel  - C3, C4, dsDNA  - ESR, CRP  - U/A and UPC                   CDAI interpretation:  0.0-2.8 Remission   2.9-10.0 Low disease activity   10.1-22.0 Moderate disease activity   22.1-76.0 High disease activity       F/U as scheduled in November with Dr. Tomasa Rand    I personally spent *** minutes face-to-face and non-face-to-face in the Hernandez of this patient, which includes all pre, intra, and post visit time on the date of service.  All documented time was specific to the E/M visit and does not include any procedures that may have been performed.      Primary Hernandez Provider: [facility]    HPI:  Mikayla Hernandez is a 32 y.o.  female with a PMHx of SLE/RA overlap who presents today for follow-up of SLE/RA overlap.    Last Visit: 04/11/23 with Dr. Tomasa Rand    Interim history:    HCQ?  Eye exam?    Rituxan?  ***need second infusion    Off orencia?    Joints?  Swelling?  AM stiffness?    Pt score?    Rash?  Oral ulcers?  Changes to urine     Fever?  Illness?  Infections?        History  She established 02/23/20 with Mikayla Hernandez. Patient was diagnosed at outside clinic in Nov 2019 and initial presenting symptoms were numbness/tingling in her b/l hands, b/l wrist pain and swelling, malar rash. Patient also developed knee/shoulder pain. ANA 1:1280, positive Sm, RNP, SSA, CCP, RF, and cardiolipin IgM with negative dsDNA and normal complements. She has previously tried hydroxychloroquine for periods of time in the past without much symptomatic relief.      Review of Systems:  Positive findings noted above, otherwise a 14 point review of systems was reviewed and negative    Past Medical, Surgical, Family and Social History reviewed and updated per EMR     Allergies:  Prednisone    Medications:     Current Outpatient Medications:     abatacept (ORENCIA CLICKJECT) 125 mg/mL AtIn subcutaneous auto-injector, Inject the contents of 1 auto-injector (125 mg total) under the skin every seven (7) days., Disp: 12 mL, Rfl: 3    acetaminophen (TYLENOL) 325 MG tablet, Take 2 tablets (650  mg total) by mouth Every six (6) hours., Disp: 120 tablet, Rfl: 0    amLODIPine-benazepril (LOTREL) 5-10 mg per capsule, Take 1 capsule by mouth daily., Disp: 30 capsule, Rfl: 5    empty container Misc, USE AS DIRECTED, Disp: 1 each, Rfl: 2    famotidine (PEPCID) 20 MG tablet, , Disp: , Rfl:     hydroxychloroquine (PLAQUENIL) 200 mg tablet, Take 1 tablet (200 mg total) by mouth daily. Take one tablet daily, Disp: 90 tablet, Rfl: 4    ibuprofen (MOTRIN) 600 MG tablet, Take 1 tablet (600 mg total) by mouth Every six (6) hours. (Patient taking differently: Take 1 tablet (600 mg total) by mouth every six (6) hours. Is taking 800mg ), Disp: 60 tablet, Rfl: 0    multivitamin, prenatal, folic acid-iron, 27-1 mg Tab, Take 1 tablet by mouth daily., Disp: , Rfl:     Current Facility-Administered Medications:     methylPREDNISolone acetate (DEPO-MEDROL) injection 80 mg, 80 mg, Intramuscular, Once, Flordia Kassem, Mikayla Finner, FNP      Objective   There were no vitals filed for this visit.      Physical Exam***  General: well appearing, no acute distress  Eyes: EOMI, normal conjunctivae   ENT: MMM.  Oropharynx without any erythema or exudate.  No oral or nasal ulcers.  Neck: supple. No cervical lymphadenopathy  Cardiovascular: Regular rate and rhythm. Nl S1 and S2.  Pulmonary: Nl WOB on RA, CTAB.  Skin: no rash, lesions, breakdown. No purpura or petechiae. No digital ulcers.   Extremities: Warm and well perfused, no cyanosis, clubbing or edema  Musculoskeletal: Swelling with TTP of the 3rd PIP bilaterally, 2-3 MCP on R and 2nd MCP on L. Wrists are also swollen with TTP. Unable to make a fist, wrist ROM is severely reduced. Otherwise, no synovitis or tenderness to palpation in the hands, wrists, elbows, shoulders, knees, ankles, or feet. Full ROM throughout.   Neurologic: Cranial nerves grossly intact, strength 5/5 throughout.  Normal sensation  Psychiatric: Normal mood and affect.

## 2023-08-04 DIAGNOSIS — M329 Systemic lupus erythematosus, unspecified: Principal | ICD-10-CM

## 2023-08-04 DIAGNOSIS — O99891 Systemic lupus erythematosus affecting pregnancy (CMS-HCC): Principal | ICD-10-CM

## 2023-08-04 DIAGNOSIS — M0579 Rheumatoid arthritis with rheumatoid factor of multiple sites without organ or systems involvement: Principal | ICD-10-CM

## 2023-09-10 NOTE — Unmapped (Unsigned)
Rheumatology Clinic Follow-up Note     Assessment/Plan:    Mikayla Hernandez is a 32 y.o.  female with a PMHx of SLE/RA overlap who presents today for follow-up of SLE/RA overlap.    Serologies: +ANA, +RNP, +SSA, +RF (147), +CCP (>340)  Manifestations: inflammatory arthritis, malar rash, oral ulcers  Current Treatment: HCQ (has come off [place] and Orencia since last visit)    ***    SLE/RA overlap: First time meeting her myself today. 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 Unfortunately we have had challenges maintaining therapy (lost to follow up, difficulty obtaining follow up labs, pregnancy limited treatment before). Now returning today in setting of being off therapy with CDAI of 29.    We discussed options today and in light of her challenges with follow up and coordinating medication we will proceed with Rituximab infusion. Reviewed dosing, route, adverse effect profile, and recommendation to update vaccines prior to starting (PCV 20 and COVID). Patient is amenable.  - Start Rituximab 1g x2  - Cont HCQ 200mg  daily  - CBC w/ diff, Cr, hepatic panel  - C3, C4, dsDNA  - ESR, CRP  - U/A and UPC                   CDAI interpretation:  0.0-2.8 Remission   2.9-10.0 Low disease activity   10.1-22.0 Moderate disease activity   22.1-76.0 High disease activity       F/U as scheduled in November with Dr. Tomasa Hernandez***    I personally spent *** minutes face-to-face and non-face-to-face in the care of this patient, which includes all pre, intra, and post visit time on the date of service.  All documented time was specific to the E/M visit and does not include any procedures that may have been performed.        Primary Care Provider: Center, [facility]    HPI:  Mikayla Hernandez is a 32 y.o.  female with a PMHx of SLE/RA overlap who presents today for follow-up of SLE/RA overlap.    Last Visit: 04/11/23 with Dr. Tomasa Hernandez    Today    Rituxan?07/01/23    HCQ?  Eye exam?    Joints?  Swelling?  AM stiffness?    Pt score?    Rash?  Oral ulcers?  Changes to urine?    Fever?  Illness?  Infections?      History  She established 02/23/20 with Dr. Marland Hernandez. Patient was diagnosed at outside clinic in Nov 2019 and initial presenting symptoms were numbness/tingling in her b/l hands, b/l wrist pain and swelling, malar rash. Patient also developed knee/shoulder pain. ANA 1:1280, positive Sm, RNP, SSA, CCP, RF, and cardiolipin IgM with negative dsDNA and normal complements. She has previously tried hydroxychloroquine for periods of time in the past without much symptomatic relief.      Review of Systems:  Positive findings noted above, otherwise a 14 point review of systems was reviewed and negative    Past Medical, Surgical, Family and Social History reviewed and updated per EMR     Allergies:  Prednisone    Medications:     Current Outpatient Medications:     abatacept (ORENCIA CLICKJECT) 125 mg/mL AtIn subcutaneous auto-injector, Inject the contents of 1 auto-injector (125 mg total) under the skin every seven (7) days., Disp: 12 mL, Rfl: 3    acetaminophen (TYLENOL) 325 MG tablet, Take 2 tablets (650 mg total) by mouth Every six (6) hours., Disp: 120 tablet,  Rfl: 0    amLODIPine-benazepril (LOTREL) 5-10 mg per capsule, Take 1 capsule by mouth daily., Disp: 30 capsule, Rfl: 5    empty container Misc, USE AS DIRECTED, Disp: 1 each, Rfl: 2    famotidine (PEPCID) 20 MG tablet, , Disp: , Rfl:     hydroxychloroquine (PLAQUENIL) 200 mg tablet, Take 1 tablet (200 mg total) by mouth daily. Take one tablet daily, Disp: 90 tablet, Rfl: 4    ibuprofen (MOTRIN) 600 MG tablet, Take 1 tablet (600 mg total) by mouth Every six (6) hours. (Patient taking differently: Take 1 tablet (600 mg total) by mouth every six (6) hours. Is taking 800mg ), Disp: 60 tablet, Rfl: 0    multivitamin, prenatal, folic acid-iron, 27-1 mg Tab, Take 1 tablet by mouth daily., Disp: , Rfl:     Current Facility-Administered Medications:     methylPREDNISolone acetate (DEPO-MEDROL) injection 80 mg, 80 mg, Intramuscular, Once, Mikayla Hernandez, Mikayla Finner, FNP      Objective   There were no vitals filed for this visit.      Physical Exam***  General: well appearing, no acute distress  Eyes: EOMI, normal conjunctivae   ENT: MMM.  Oropharynx without any erythema or exudate.  No oral or nasal ulcers.  Neck: supple. No cervical lymphadenopathy  Cardiovascular: Regular rate and rhythm. Nl S1 and S2.  Pulmonary: Nl WOB on RA, CTAB.  Skin: no rash, lesions, breakdown. No purpura or petechiae. No digital ulcers.   Extremities: Warm and well perfused, no cyanosis, clubbing or edema  Musculoskeletal: Swelling with TTP of the 3rd PIP bilaterally, 2-3 MCP on R and 2nd MCP on L. Wrists are also swollen with TTP. Unable to make a fist, wrist ROM is severely reduced. Otherwise, no synovitis or tenderness to palpation in the hands, wrists, elbows, shoulders, knees, ankles, or feet. Full ROM throughout.   Neurologic: Cranial nerves grossly intact, strength 5/5 throughout.  Normal sensation  Psychiatric: Normal mood and affect.

## 2023-09-13 NOTE — Unmapped (Signed)
The [facility] has made a second and final attempt to reach this patient to refill the following medication:ORENCIA CLICKJECT 125 mg/mL Atin subcutaneous auto-injector (abatacept).      We have left voicemails on the following phone numbers: [phone], have sent a MyChart message, have sent a text message to the following phone numbers: [phone], and have sent a Mychart questionnaire..    Dates contacted: 08/22/23-09/13/23  Last scheduled delivery: 07/18/23    The patient may be at risk of non-compliance with this medication. The patient should call the [facility] at [phone]  Option 4, then Option 2: Dermatology, Gastroenterology, Rheumatology to refill medication.    Craige Cotta   [facility]

## 2023-09-18 DIAGNOSIS — M0579 Rheumatoid arthritis with rheumatoid factor of multiple sites without organ or systems involvement: Principal | ICD-10-CM

## 2023-09-19 NOTE — Unmapped (Signed)
[facility] - Enhanced Care Program  Reason for Call: Medication Access; Type: SSC Refill Call  Referral Request: Rheumatology - Sheliah Mends, CPP    Summary of Telephone Encounter  The [facility] has made multiple attempts to reach pt to coordinate refill of Orencia Clickject 125 mg/mL to prevent any gaps in treatment.   I called pt at phone # [phone] but the call went right to VM. I left pt a message and provided the contact number to [facility] [phone] option 4 then option 2).  Follow-Up:  Continue to reach out to pt to assist with refill coordination of Orencia.     Call Attempt #: 1  Time Spent on Referral: 10 minutes  Number of Days Spent on Referral: 1    Aldean Ast, [facility]   Pharmacy Department  [facility]  [address]   [place], [place] [address]  [phone]

## 2023-09-20 NOTE — Unmapped (Signed)
[facility] - Enhanced Care Program  Reason for Call: Medication Access; Type: SSC Refill Call  Referral Request: Rheumatology - Sheliah Mends, CPP    Summary of Telephone Encounter  The [facility] has made multiple attempts to reach pt to coordinate refill of Orencia Clickject 125 mg/mL to prevent any gaps in treatment.   I made another attempt to reach pt at phone # [phone] but the call went right to VM. I left pt another message and provided the contact number to [facility] [phone] option 4 then option 2).  Follow-Up:  Continue to reach out to pt to assist with refill coordination of Orencia.     Call Attempt #: 2  Time Spent on Referral: 15 minutes  Number of Days Spent on Referral: 2    Vertell Limber RN, BSN  Nursing [facility]   Pharmacy Department  [facility]  [address]   [place], [place] [address]  [phone]

## 2023-09-23 NOTE — Unmapped (Signed)
[facility] - Enhanced Care Program  Reason for Call: Medication Access; Type: SSC Refill Call  Referral Request: Rheumatology - Sheliah Mends, CPP    Summary of Telephone Encounter  The [facility] has made multiple attempts to reach pt to coordinate refill of Orencia Clickject 125 mg/mL to prevent any gaps in treatment.   I made another attempt to reach pt at phone # [phone]. I was able to speak with pt this morning.   Pt let me know she was having side effects of menstrual bleeding and has been off Orencia now for 3 wks and the bleeding has stopped. Pt stated honestly she did not know if it was the Orencia or not causing the bleeding because she has the Nexplanon in her arm and has occasional bleeding. Since being on Orencia she has had continuous bleeding. She would take her injection on Friday and have more heavy menstrual bleeding and it would continue unitl her next injections. Pt stated she she has stopped her injections the bleeding has stopped. Pt would like to discuss other option with Dr. Tomasa Rand at her apt on 10/23/23 @ 11:40 am.   I will pass along the information to Sheliah Mends, CPP and Dr. Tomasa Rand.   Follow-Up:  Referral is complete and will be closed.     Call Attempt #: 3  Time Spent on Referral: 20 minutes  Number of Days Spent on Referral: 3    Vertell Limber RN, [facility]   Pharmacy Department  [facility]  [address]   [place], [place] [address]  [phone]

## 2023-10-13 DIAGNOSIS — O99891 Systemic lupus erythematosus affecting pregnancy (CMS-HCC): Principal | ICD-10-CM

## 2023-10-13 DIAGNOSIS — M0579 Rheumatoid arthritis with rheumatoid factor of multiple sites without organ or systems involvement: Principal | ICD-10-CM

## 2023-10-13 DIAGNOSIS — M329 Systemic lupus erythematosus, unspecified: Principal | ICD-10-CM

## 2023-10-24 NOTE — Unmapped (Signed)
Patient states she will email her job to see if she can get off early for an appt on Tuesday.  She will call back to schedule if she gets off.

## 2023-11-04 ENCOUNTER — Ambulatory Visit
Admission: EM | Admit: 2023-11-04 | Discharge: 2023-11-04 | Disposition: A | Discharge status: Home / Self Care | Payer: Medicaid Other

## 2023-11-04 DIAGNOSIS — J069 Acute upper respiratory infection, unspecified: Secondary | ICD-10-CM | POA: Diagnosis not present

## 2023-11-04 LAB — POCT RAPID STREP A (OFFICE): Rapid Strep A Screen: NEGATIVE

## 2023-11-04 LAB — POCT INFLUENZA A/B
Influenza A, POC: NEGATIVE
Influenza B, POC: NEGATIVE

## 2023-11-04 MED ORDER — OSELTAMIVIR PHOSPHATE 75 MG PO CAPS: 75.0000 mg | ORAL_CAPSULE | Freq: Two times a day (BID) | ORAL | 0 refills | Status: AC

## 2023-11-04 NOTE — ED Provider Notes (Signed)
[facility]    CSN: [id number] Arrival date & time: 11/04/23  0901      History   Chief Complaint Chief Complaint  Patient presents with   Cough   Headache   Nasal Congestion    HPI Claire Wood is a 32 y.o. female.   Patient here today for evaluation of scratchy throat that started yesterday.  She states that she took over-the-counter medication but symptoms have progressed and today she states she feels as if she has "been hit by a bus".  Her children have also been sick with viral illnesses and one of her children has tested positive for strep and the other for flu.  She has tried taking NyQuil without resolution.   Cough Associated symptoms: fever, headaches and sore throat   Associated symptoms: no ear pain, no eye discharge, no shortness of breath and no wheezing   Headache Associated symptoms: congestion, cough, fever and sore throat   Associated symptoms: no abdominal pain, no diarrhea, no ear pain, no nausea and no vomiting     Past Medical History:  Diagnosis Date   Lupus    RA (rheumatoid arthritis) (HCC)     Patient Active Problem List   Diagnosis Date Noted   Rheumatoid arthritis involving multiple sites with positive rheumatoid factor (HCC) 04/12/2023   Gestational hypertension, third trimester 03/12/2022   Supervision of high risk pregnancy, antepartum 11-Mar-2022   Pyelonephritis affecting pregnancy 03/11/2022   Lupus anticoagulant affecting pregnancy in third trimester, antepartum (HCC) 11-Mar-2022   Preterm uterine contractions in third trimester, antepartum 2022/03/11   History of neonatal death 01-14-22   Previous cesarean delivery affecting pregnancy January 14, 2022   Drug use affecting pregnancy 10/11/2021   E. coli UTI 10/11/2021   History of preterm labor, current pregnancy 10/09/2021   Pregnancy 10/09/2021   Systemic lupus erythematosus affecting pregnancy (HCC) 03/22/2020   Supervision of other high risk pregnancies, third  trimester 03/22/2020    Past Surgical History:  Procedure Laterality Date   CESAREAN SECTION      OB History     Gravida  4   Para  3   Term  0   Preterm  3   AB  0   Living  2      SAB  0   IAB  0   Ectopic  0   Multiple  1   Live Births  3            Home Medications    Prior to Admission medications   Medication Sig Start Date End Date Taking? Authorizing Provider  acetaminophen (TYLENOL) 650 MG CR tablet Take 650 mg by mouth every 8 (eight) hours as needed for pain or fever. 12/14/21  Yes [provider]  amLODipine-benazepril (LOTREL) 5-10 MG capsule Take 1 capsule by mouth daily. 04/27/22  Yes [provider]  hydroxychloroquine (PLAQUENIL) 200 MG tablet Take 200 mg by mouth daily. 02/23/20  Yes [provider]  hydroxychloroquine (PLAQUENIL) 200 MG tablet Take 200 mg by mouth daily. 04/12/23  Yes [provider]  methylPREDNISolone acetate (DEPO-MEDROL) 80 MG/ML injection Inject 80 mg into the muscle once. 08/02/22  Yes [provider]  oseltamivir (TAMIFLU) 75 MG capsule Take 1 capsule (75 mg total) by mouth every 12 (twelve) hours. 11/04/23  Yes Tomi Bamberger, PA-C  Prenat w/o A-FE-Methfol-FA-DHA (VITAMEDMD ONE RX/QUATREFOLIC) 30-0.6-0.4-200 MG CAPS Take 1 tablet by mouth daily. 10/09/21  Yes [provider]  promethazine (PHENERGAN) 12.5 MG tablet  Take 12.5 mg by mouth every 6 (six) hours as needed for nausea. 10/09/21  Yes [provider]  Pseudoeph-Doxylamine-DM-APAP (NYQUIL PO) Take by mouth.   Yes [provider]  Pseudoephedrine-APAP-DM (DAYQUIL PO) Take by mouth.   Yes [provider]  Sharps Container (BD SHARPS COLLECTOR) MISC See admin instructions. 09/14/22  Yes [provider]  acetaminophen (TYLENOL) 500 MG tablet Take 500 mg by mouth at bedtime as needed for pain.    [provider]  azaTHIOprine (IMURAN) 50 MG tablet Take 50 mg by mouth daily.  02/12/22   [provider]  famotidine (PEPCID) 20 MG tablet Take 20 mg by mouth 2 (two) times daily. 02/12/22   [provider]  ibuprofen (ADVIL) 800 MG tablet Take 1 tablet (800 mg total) by mouth every 8 (eight) hours as needed for mild pain or moderate pain. 06/21/22   Edwin Dada P, DO  ORENCIA CLICKJECT 125 MG/ML SOAJ Inject into the skin.    [provider]  Prenat-FeAsp-Meth-FA-DHA w/o A (PRENATE PIXIE) 10-0.6-0.4-200 MG CAPS Take 1 capsule by mouth daily. 02/13/22   [provider]    Family History History reviewed. No pertinent family history.  Social History Social History   Tobacco Use   Smoking status: Never   Smokeless tobacco: Never  Vaping Use   Vaping status: Never Used  Substance Use Topics   Alcohol use: Not Currently   Drug use: Not Currently     Allergies   Prednisone   Review of Systems Review of Systems  Constitutional:  Positive for fever.  HENT:  Positive for congestion and sore throat. Negative for ear pain.   Eyes:  Negative for discharge and redness.  Respiratory:  Positive for cough. Negative for shortness of breath and wheezing.   Gastrointestinal:  Negative for abdominal pain, diarrhea, nausea and vomiting.  Neurological:  Positive for headaches.     Physical Exam Triage Vital Signs ED Triage Vitals  Encounter Vitals Group     BP 11/04/23 0943 136/85     Systolic BP Percentile --      Diastolic BP Percentile --      Pulse Rate 11/04/23 0943 92     Resp 11/04/23 0943 18     Temp 11/04/23 0943 99 F (37.2 C)     Temp Source 11/04/23 0943 Oral     SpO2 11/04/23 0943 99 %     Weight 11/04/23 0940 134 lb 14.7 oz (61.2 kg)     Height 11/04/23 0940 5\' 4"  (1.626 m)     Head Circumference --      Peak Flow --      Pain Score 11/04/23 0938 0     Pain Loc --      Pain Education --      Exclude from Growth Chart --    No data found.  Updated Vital Signs BP 136/85 (BP Location: Left Arm)   Pulse 92    Temp 99 F (37.2 C) (Oral)   Resp 18   Ht 5\' 4"  (1.626 m)   Wt 134 lb 14.7 oz (61.2 kg)   LMP 10/29/2023 (Exact Date)   SpO2 99%   BMI 23.16 kg/m   Visual Acuity Right Eye Distance:   Left Eye Distance:   Bilateral Distance:    Right Eye Near:   Left Eye Near:    Bilateral Near:     Physical Exam Vitals and nursing note reviewed.  Constitutional:  General: She is not in acute distress.    Appearance: Normal appearance. She is not ill-appearing.  HENT:     Head: Normocephalic and atraumatic.     Nose: Congestion present.     Mouth/Throat:     Mouth: Mucous membranes are moist.     Pharynx: Posterior oropharyngeal erythema present. No oropharyngeal exudate.  Eyes:     Conjunctiva/sclera: Conjunctivae normal.  Cardiovascular:     Rate and Rhythm: Normal rate and regular rhythm.     Heart sounds: Normal heart sounds. No murmur heard. Pulmonary:     Effort: Pulmonary effort is normal. No respiratory distress.     Breath sounds: Normal breath sounds. No wheezing, rhonchi or rales.  Skin:    General: Skin is warm and dry.  Neurological:     Mental Status: She is alert.  Psychiatric:        Mood and Affect: Mood normal.        Thought Content: Thought content normal.      UC Treatments / Results  Labs (all labs ordered are listed, but only abnormal results are displayed) Labs Reviewed  POCT INFLUENZA A/B - Normal  POCT RAPID STREP A (OFFICE) - Normal    EKG   Radiology No results found.  Procedures Procedures (including critical care time)  Medications Ordered in UC Medications - No data to display  Initial Impression / Assessment and Plan / UC Course  I have reviewed the triage vital signs and the nursing notes.  Pertinent labs & imaging results that were available during my care of the patient were reviewed by me and considered in my medical decision making (see chart for details).    Rapid strep and flu screening negative.  Given known  exposure will treat with Tamiflu.  Advised symptomatic treatment otherwise, increase fluids and rest with follow-up if no gradual improvement with any further concerns.  Final Clinical Impressions(s) / UC Diagnoses   Final diagnoses:  Acute upper respiratory infection   Discharge Instructions   None    ED Prescriptions     Medication Sig Dispense Auth. Provider   oseltamivir (TAMIFLU) 75 MG capsule Take 1 capsule (75 mg total) by mouth every 12 (twelve) hours. 10 capsule Tomi Bamberger, PA-C      PDMP not reviewed this encounter.   Tomi Bamberger, PA-C 11/04/23 1045

## 2023-11-04 NOTE — ED Triage Notes (Signed)
"  Started with scratchy throat yesterday, I took OTC medications the day progressed and then developed runny nose and cough". "Today I feel like I have been hit by a bus". "My children have been sick with viruses too but seem to be better". "One of my kids had strep and one + for Flu".

## 2023-11-10 DIAGNOSIS — M329 Systemic lupus erythematosus, unspecified: Principal | ICD-10-CM

## 2023-11-10 DIAGNOSIS — M0579 Rheumatoid arthritis with rheumatoid factor of multiple sites without organ or systems involvement: Principal | ICD-10-CM

## 2023-11-10 DIAGNOSIS — O99891 Systemic lupus erythematosus affecting pregnancy (CMS-HCC): Principal | ICD-10-CM

## 2023-12-08 DIAGNOSIS — M329 Systemic lupus erythematosus, unspecified: Principal | ICD-10-CM

## 2023-12-08 DIAGNOSIS — O99891 Systemic lupus erythematosus affecting pregnancy (CMS-HCC): Principal | ICD-10-CM

## 2023-12-08 DIAGNOSIS — M0579 Rheumatoid arthritis with rheumatoid factor of multiple sites without organ or systems involvement: Principal | ICD-10-CM

## 2023-12-30 NOTE — Unmapped (Signed)
Specialty Medication(s): ORENCIA    Mikayla Hernandez has been dis-enrolled from the [facility] specialty pharmacy services due to multiple unsuccessful outreach attempts by the pharmacy.    Additional information provided to the patient: last filled 07/18/23 for 28 days    [name], PharmD  [facility] Specialty Pharmacist

## 2024-01-05 DIAGNOSIS — M0579 Rheumatoid arthritis with rheumatoid factor of multiple sites without organ or systems involvement: Principal | ICD-10-CM

## 2024-01-05 DIAGNOSIS — M329 Systemic lupus erythematosus, unspecified: Principal | ICD-10-CM

## 2024-01-05 DIAGNOSIS — O99891 Systemic lupus erythematosus affecting pregnancy (CMS-HCC): Principal | ICD-10-CM

## 2024-02-02 DIAGNOSIS — M329 Systemic lupus erythematosus, unspecified: Principal | ICD-10-CM

## 2024-02-02 DIAGNOSIS — O99891 Systemic lupus erythematosus affecting pregnancy (CMS-HCC): Principal | ICD-10-CM

## 2024-02-02 DIAGNOSIS — M0579 Rheumatoid arthritis with rheumatoid factor of multiple sites without organ or systems involvement: Principal | ICD-10-CM

## 2024-02-12 ENCOUNTER — Ambulatory Visit
Admit: 2024-02-12 | Discharge: 2024-02-13 | Payer: PRIVATE HEALTH INSURANCE | Attending: Student in an Organized Health Care Education/Training Program | Primary: Student in an Organized Health Care Education/Training Program

## 2024-02-12 DIAGNOSIS — O99891 Systemic lupus erythematosus affecting pregnancy (CMS-HCC): Principal | ICD-10-CM

## 2024-02-12 DIAGNOSIS — M329 Systemic lupus erythematosus, unspecified: Principal | ICD-10-CM

## 2024-02-12 DIAGNOSIS — M0579 Rheumatoid arthritis with rheumatoid factor of multiple sites without organ or systems involvement: Principal | ICD-10-CM

## 2024-02-12 DIAGNOSIS — M3219 Other organ or system involvement in systemic lupus erythematosus: Principal | ICD-10-CM

## 2024-02-12 MED ORDER — METHYLPREDNISOLONE 4 MG TABLET
ORAL_TABLET | ORAL | 0 refills | 44.00 days | Status: CP
Start: 2024-02-12 — End: 2024-03-27

## 2024-03-15 DIAGNOSIS — O99891 Systemic lupus erythematosus affecting pregnancy: Principal | ICD-10-CM

## 2024-03-15 DIAGNOSIS — M0579 Rheumatoid arthritis with rheumatoid factor of multiple sites without organ or systems involvement: Principal | ICD-10-CM

## 2024-03-15 DIAGNOSIS — M329 Systemic lupus erythematosus, unspecified: Principal | ICD-10-CM

## 2024-03-25 ENCOUNTER — Ambulatory Visit: Payer: Self-pay

## 2024-04-07 ENCOUNTER — Encounter: Admit: 2024-04-07 | Discharge: 2024-04-07 | Payer: Medicaid (Managed Care)

## 2024-04-07 ENCOUNTER — Ambulatory Visit: Admit: 2024-04-07 | Discharge: 2024-04-07 | Payer: Medicaid (Managed Care)

## 2024-04-07 DIAGNOSIS — M329 Systemic lupus erythematosus, unspecified: Principal | ICD-10-CM

## 2024-04-07 DIAGNOSIS — M0579 Rheumatoid arthritis with rheumatoid factor of multiple sites without organ or systems involvement: Principal | ICD-10-CM

## 2024-04-07 DIAGNOSIS — T8090XA Unspecified complication following infusion and therapeutic injection, initial encounter: Principal | ICD-10-CM

## 2024-04-07 DIAGNOSIS — O99891 Systemic lupus erythematosus affecting pregnancy: Principal | ICD-10-CM

## 2024-04-20 ENCOUNTER — Encounter: Admit: 2024-04-20 | Discharge: 2024-04-21 | Payer: Medicaid (Managed Care)

## 2024-04-20 DIAGNOSIS — M329 Systemic lupus erythematosus, unspecified: Principal | ICD-10-CM

## 2024-04-20 DIAGNOSIS — O99891 Systemic lupus erythematosus affecting pregnancy: Principal | ICD-10-CM

## 2024-04-20 DIAGNOSIS — M0579 Rheumatoid arthritis with rheumatoid factor of multiple sites without organ or systems involvement: Principal | ICD-10-CM

## 2024-05-10 DIAGNOSIS — M329 Systemic lupus erythematosus, unspecified: Principal | ICD-10-CM

## 2024-05-10 DIAGNOSIS — O99891 Systemic lupus erythematosus affecting pregnancy: Principal | ICD-10-CM

## 2024-05-10 DIAGNOSIS — M0579 Rheumatoid arthritis with rheumatoid factor of multiple sites without organ or systems involvement: Principal | ICD-10-CM

## 2024-06-07 DIAGNOSIS — M0579 Rheumatoid arthritis with rheumatoid factor of multiple sites without organ or systems involvement: Principal | ICD-10-CM

## 2024-06-07 DIAGNOSIS — O99891 Systemic lupus erythematosus affecting pregnancy: Principal | ICD-10-CM

## 2024-06-07 DIAGNOSIS — M329 Systemic lupus erythematosus, unspecified: Principal | ICD-10-CM

## 2024-07-05 DIAGNOSIS — M329 Systemic lupus erythematosus, unspecified: Principal | ICD-10-CM

## 2024-07-05 DIAGNOSIS — O99891 Systemic lupus erythematosus affecting pregnancy: Principal | ICD-10-CM

## 2024-07-05 DIAGNOSIS — M0579 Rheumatoid arthritis with rheumatoid factor of multiple sites without organ or systems involvement: Principal | ICD-10-CM

## 2024-08-11 ENCOUNTER — Encounter
Admit: 2024-08-11 | Discharge: 2024-08-12 | Payer: Medicaid (Managed Care) | Attending: Student in an Organized Health Care Education/Training Program | Primary: Student in an Organized Health Care Education/Training Program

## 2024-08-11 DIAGNOSIS — M329 Systemic lupus erythematosus, unspecified: Principal | ICD-10-CM

## 2024-08-11 DIAGNOSIS — O99891 Systemic lupus erythematosus affecting pregnancy    (CMS-HCC): Principal | ICD-10-CM

## 2024-08-11 DIAGNOSIS — M0579 Rheumatoid arthritis with rheumatoid factor of multiple sites without organ or systems involvement: Principal | ICD-10-CM

## 2024-08-11 MED ORDER — METHYLPREDNISOLONE 4 MG TABLET
ORAL_TABLET | ORAL | 0 refills | 30.00000 days
Start: 2024-08-11 — End: 2024-09-10

## 2024-08-12 DIAGNOSIS — M0579 Rheumatoid arthritis with rheumatoid factor of multiple sites without organ or systems involvement: Principal | ICD-10-CM

## 2024-08-12 DIAGNOSIS — M329 Systemic lupus erythematosus, unspecified: Principal | ICD-10-CM

## 2024-08-12 DIAGNOSIS — O99891 Systemic lupus erythematosus affecting pregnancy    (CMS-HCC): Principal | ICD-10-CM

## 2024-08-12 MED ORDER — METHYLPREDNISOLONE 4 MG TABLET
ORAL_TABLET | ORAL | 0 refills | 30.00000 days | Status: CP
Start: 2024-08-12 — End: 2024-09-11

## 2024-11-08 DIAGNOSIS — M0579 Rheumatoid arthritis with rheumatoid factor of multiple sites without organ or systems involvement: Principal | ICD-10-CM

## 2024-11-08 DIAGNOSIS — O99891 Systemic lupus erythematosus affecting pregnancy    (CMS-HCC): Principal | ICD-10-CM

## 2024-11-08 DIAGNOSIS — M329 Systemic lupus erythematosus, unspecified: Principal | ICD-10-CM

## 2024-12-06 DIAGNOSIS — M0579 Rheumatoid arthritis with rheumatoid factor of multiple sites without organ or systems involvement: Principal | ICD-10-CM

## 2024-12-06 DIAGNOSIS — M329 Systemic lupus erythematosus, unspecified: Principal | ICD-10-CM

## 2024-12-06 DIAGNOSIS — O99891 Systemic lupus erythematosus affecting pregnancy    (CMS-HCC): Principal | ICD-10-CM

## 2025-01-03 DIAGNOSIS — M329 Systemic lupus erythematosus, unspecified: Secondary | ICD-10-CM

## 2025-01-03 DIAGNOSIS — O99891 Systemic lupus erythematosus affecting pregnancy    (CMS-HCC): Principal | ICD-10-CM

## 2025-01-03 DIAGNOSIS — M0579 Rheumatoid arthritis with rheumatoid factor of multiple sites without organ or systems involvement: Secondary | ICD-10-CM
